# Patient Record
Sex: Female | Born: 1950 | Hispanic: No | Smoking: Never smoker
Health system: Southern US, Community
[De-identification: ages and names within clinical notes are randomized; demographics above are authoritative.]

## PROBLEM LIST (undated history)

## (undated) DIAGNOSIS — C801 Malignant (primary) neoplasm, unspecified: Secondary | ICD-10-CM

---

## 1997-12-20 ENCOUNTER — Other Ambulatory Visit: Admission: RE | Admit: 1997-12-20 | Discharge: 1997-12-20 | Payer: Self-pay | Admitting: *Deleted

## 1998-03-24 ENCOUNTER — Other Ambulatory Visit: Admission: RE | Admit: 1998-03-24 | Discharge: 1998-03-24 | Payer: Self-pay | Admitting: *Deleted

## 1998-05-24 ENCOUNTER — Other Ambulatory Visit: Admission: RE | Admit: 1998-05-24 | Discharge: 1998-05-24 | Payer: Self-pay | Admitting: *Deleted

## 1999-02-02 ENCOUNTER — Other Ambulatory Visit: Admission: RE | Admit: 1999-02-02 | Discharge: 1999-02-02 | Payer: Self-pay | Admitting: *Deleted

## 1999-05-25 ENCOUNTER — Emergency Department (HOSPITAL_COMMUNITY): Admission: EM | Admit: 1999-05-25 | Discharge: 1999-05-25 | Payer: Self-pay | Admitting: Emergency Medicine

## 1999-05-25 ENCOUNTER — Encounter: Payer: Self-pay | Admitting: Emergency Medicine

## 2000-02-21 ENCOUNTER — Other Ambulatory Visit: Admission: RE | Admit: 2000-02-21 | Discharge: 2000-02-21 | Payer: Self-pay | Admitting: *Deleted

## 2004-10-10 ENCOUNTER — Other Ambulatory Visit: Admission: RE | Admit: 2004-10-10 | Discharge: 2004-10-10 | Payer: Self-pay | Admitting: Obstetrics and Gynecology

## 2011-03-08 DIAGNOSIS — R05 Cough: Secondary | ICD-10-CM | POA: Insufficient documentation

## 2011-06-21 DIAGNOSIS — C07 Malignant neoplasm of parotid gland: Secondary | ICD-10-CM | POA: Insufficient documentation

## 2012-09-10 ENCOUNTER — Other Ambulatory Visit (HOSPITAL_COMMUNITY)
Admission: RE | Admit: 2012-09-10 | Discharge: 2012-09-10 | Disposition: A | Discharge status: Home / Self Care | Payer: BC Managed Care – PPO | Source: Ambulatory Visit | Attending: Family Medicine | Admitting: Family Medicine

## 2012-09-10 ENCOUNTER — Other Ambulatory Visit: Payer: Self-pay | Admitting: Physician Assistant

## 2012-09-10 DIAGNOSIS — Z124 Encounter for screening for malignant neoplasm of cervix: Secondary | ICD-10-CM | POA: Insufficient documentation

## 2014-03-08 ENCOUNTER — Encounter (HOSPITAL_COMMUNITY): Payer: Self-pay | Admitting: Nurse Practitioner

## 2014-03-08 ENCOUNTER — Emergency Department (HOSPITAL_COMMUNITY)
Admission: EM | Admit: 2014-03-08 | Discharge: 2014-03-08 | Disposition: A | Discharge status: Home / Self Care | Payer: BC Managed Care – PPO | Attending: Emergency Medicine | Admitting: Emergency Medicine

## 2014-03-08 DIAGNOSIS — Z859 Personal history of malignant neoplasm, unspecified: Secondary | ICD-10-CM | POA: Insufficient documentation

## 2014-03-08 DIAGNOSIS — R102 Pelvic and perineal pain: Secondary | ICD-10-CM | POA: Diagnosis present

## 2014-03-08 DIAGNOSIS — Z7982 Long term (current) use of aspirin: Secondary | ICD-10-CM | POA: Insufficient documentation

## 2014-03-08 DIAGNOSIS — N3091 Cystitis, unspecified with hematuria: Secondary | ICD-10-CM | POA: Insufficient documentation

## 2014-03-08 DIAGNOSIS — Z79899 Other long term (current) drug therapy: Secondary | ICD-10-CM | POA: Diagnosis not present

## 2014-03-08 HISTORY — DX: Malignant (primary) neoplasm, unspecified: C80.1

## 2014-03-08 LAB — CBC WITH DIFFERENTIAL/PLATELET
Basophils Absolute: 0 10*3/uL (ref 0.0–0.1)
Basophils Relative: 0 % (ref 0–1)
Eosinophils Absolute: 0.2 10*3/uL (ref 0.0–0.7)
Eosinophils Relative: 2 % (ref 0–5)
HCT: 43.3 % (ref 36.0–46.0)
Hemoglobin: 14.6 g/dL (ref 12.0–15.0)
Lymphocytes Relative: 18 % (ref 12–46)
Lymphs Abs: 2 10*3/uL (ref 0.7–4.0)
MCH: 30.1 pg (ref 26.0–34.0)
MCHC: 33.7 g/dL (ref 30.0–36.0)
MCV: 89.3 fL (ref 78.0–100.0)
Monocytes Absolute: 0.6 10*3/uL (ref 0.1–1.0)
Monocytes Relative: 5 % (ref 3–12)
Neutro Abs: 8.4 10*3/uL — ABNORMAL HIGH (ref 1.7–7.7)
Neutrophils Relative %: 75 % (ref 43–77)
Platelets: 207 10*3/uL (ref 150–400)
RBC: 4.85 MIL/uL (ref 3.87–5.11)
RDW: 13 % (ref 11.5–15.5)
WBC: 11.2 10*3/uL — ABNORMAL HIGH (ref 4.0–10.5)

## 2014-03-08 LAB — URINALYSIS, ROUTINE W REFLEX MICROSCOPIC
Glucose, UA: 100 mg/dL — AB
Ketones, ur: 40 mg/dL — AB
Nitrite: POSITIVE — AB
Protein, ur: >300 mg/dL — AB
Specific Gravity, Urine: 1.026 (ref 1.005–1.030)
Urobilinogen, UA: 1 mg/dL (ref 0.0–1.0)
pH: 5 (ref 5.0–8.0)

## 2014-03-08 LAB — COMPREHENSIVE METABOLIC PANEL
ALT: 26 U/L (ref 0–35)
AST: 24 U/L (ref 0–37)
Albumin: 3.9 g/dL (ref 3.5–5.2)
Alkaline Phosphatase: 58 U/L (ref 39–117)
Anion gap: 13 (ref 5–15)
BUN: 11 mg/dL (ref 6–23)
CO2: 23 mEq/L (ref 19–32)
Calcium: 9.5 mg/dL (ref 8.4–10.5)
Chloride: 103 mEq/L (ref 96–112)
Creatinine, Ser: 0.73 mg/dL (ref 0.50–1.10)
GFR calc Af Amer: >90 mL/min (ref >90)
GFR calc non Af Amer: 90 mL/min — ABNORMAL LOW (ref >90)
Glucose, Bld: 109 mg/dL — ABNORMAL HIGH (ref 70–99)
Potassium: 4.8 mEq/L (ref 3.7–5.3)
Sodium: 139 mEq/L (ref 137–147)
Total Bilirubin: 0.3 mg/dL (ref 0.3–1.2)
Total Protein: 7.4 g/dL (ref 6.0–8.3)

## 2014-03-08 LAB — URINE MICROSCOPIC-ADD ON

## 2014-03-08 MED ORDER — PHENAZOPYRIDINE HCL 200 MG PO TABS: 200.0000 mg | ORAL_TABLET | Freq: Three times a day (TID) | ORAL | Status: DC

## 2014-03-08 MED ORDER — CIPROFLOXACIN HCL 500 MG PO TABS: 500.0000 mg | ORAL_TABLET | Freq: Two times a day (BID) | ORAL | Status: DC

## 2014-03-08 NOTE — ED Provider Notes (Signed)
CSN: [id number]     Arrival date & time 03/08/14  1054 History   First MD Initiated Contact with Patient 03/08/14 1118     Chief Complaint  Patient presents with  . Vaginal Pain     (Consider location/radiation/quality/duration/timing/severity/associated sxs/prior Treatment) HPI Comments: Patient presents with urinary bleeding. She feels like she has some urinary pressure and frequency. She also has some burning when she urinates. Her symptoms started this morning. She denies abdominal or back pain. She denies any fevers or chills. She denies he nausea or vomiting. She had symptoms like this one time before when she was 62 years old with a urinary tract infection. She does not know if the blood is coming from her urine or her vagina area.  Patient is a 63 y.o. female presenting with vaginal pain.  Vaginal Pain Pertinent negatives include no chest pain, no abdominal pain, no headaches and no shortness of breath.    Past Medical History  Diagnosis Date  . Adenocarcinoma    History reviewed. No pertinent past surgical history. History reviewed. No pertinent family history. History  Substance Use Topics  . Smoking status: Never Smoker   . Smokeless tobacco: Not on file  . Alcohol Use: Yes     Comment: rare   OB History    No data available     Review of Systems  Constitutional: Negative for fever, chills, diaphoresis and fatigue.  HENT: Negative for congestion, rhinorrhea and sneezing.   Eyes: Negative.   Respiratory: Negative for cough, chest tightness and shortness of breath.   Cardiovascular: Negative for chest pain and leg swelling.  Gastrointestinal: Negative for nausea, vomiting, abdominal pain, diarrhea and blood in stool.  Genitourinary: Positive for dysuria, urgency, frequency, hematuria and vaginal pain. Negative for flank pain and difficulty urinating.  Musculoskeletal: Negative for back pain and arthralgias.  Skin: Negative for rash.  Neurological: Negative for  dizziness, speech difficulty, weakness, numbness and headaches.      Allergies  Review of patient's allergies indicates no known allergies.  Home Medications   Prior to Admission medications   Medication Sig Start Date End Date Taking? Authorizing Provider  aspirin EC 81 MG tablet Take 81 mg by mouth daily.   Yes Historical Provider, MD  CALCIUM PO Take by mouth 2 (two) times daily.   Yes Historical Provider, MD  cetirizine (ZYRTEC) 10 MG tablet Take 10 mg by mouth daily as needed for allergies.   Yes Historical Provider, MD  CRESTOR 20 MG tablet Take 20 mg by mouth daily.  02/19/14  Yes Historical Provider, MD  metoprolol succinate (TOPROL-XL) 25 MG 24 hr tablet Take 25 mg by mouth daily.  02/11/14  Yes Historical Provider, MD  Omega-3 Fatty Acids (FISH OIL PO) Take 1 capsule by mouth daily.   Yes Historical Provider, MD  omeprazole (PRILOSEC) 20 MG capsule Take 20 mg by mouth daily.   Yes Historical Provider, MD  Polyethyl Glycol-Propyl Glycol (SYSTANE OP) Apply 1 drop to eye daily as needed (Dry eyes).   Yes Historical Provider, MD  traMADol (ULTRAM) 50 MG tablet Take 50 mg by mouth 2 (two) times daily as needed for moderate pain.  01/05/14  Yes Historical Provider, MD  ciprofloxacin (CIPRO) 500 MG tablet Take 1 tablet (500 mg total) by mouth 2 (two) times daily. One po bid x 7 days 03/08/14   Malvin Johns, MD  phenazopyridine (PYRIDIUM) 200 MG tablet Take 1 tablet (200 mg total) by mouth 3 (three) times daily. 03/08/14  Malvin Johns, MD   BP 149/102 mmHg  Pulse 94  Temp(Src) 98.5 F (36.9 C) (Oral)  Resp 16  SpO2 96% Physical Exam  Constitutional: She is oriented to person, place, and time. She appears well-developed and well-nourished.  HENT:  Head: Normocephalic and atraumatic.  Eyes: Pupils are equal, round, and reactive to light.  Neck: Normal range of motion. Neck supple.  Cardiovascular: Normal rate, regular rhythm and normal heart sounds.   Pulmonary/Chest: Effort  normal and breath sounds normal. No respiratory distress. She has no wheezes. She has no rales. She exhibits no tenderness.  Abdominal: Soft. Bowel sounds are normal. There is no tenderness. There is no rebound and no guarding.  Genitourinary:  No vaginal bleeding  Musculoskeletal: Normal range of motion. She exhibits no edema.  Lymphadenopathy:    She has no cervical adenopathy.  Neurological: She is alert and oriented to person, place, and time.  Skin: Skin is warm and dry. No rash noted.  Psychiatric: She has a normal mood and affect.    ED Course  Procedures (including critical care time) Labs Review Labs Reviewed  URINALYSIS, ROUTINE W REFLEX MICROSCOPIC - Abnormal; Notable for the following:    Color, Urine RED (*)    APPearance TURBID (*)    Glucose, UA 100 (*)    Hgb urine dipstick LARGE (*)    Bilirubin Urine LARGE (*)    Ketones, ur 40 (*)    Protein, ur >300 (*)    Nitrite POSITIVE (*)    Leukocytes, UA LARGE (*)    All other components within normal limits  CBC WITH DIFFERENTIAL - Abnormal; Notable for the following:    WBC 11.2 (*)    Neutro Abs 8.4 (*)    All other components within normal limits  COMPREHENSIVE METABOLIC PANEL - Abnormal; Notable for the following:    Glucose, Bld 109 (*)    GFR calc non Af Amer 90 (*)    All other components within normal limits  URINE CULTURE  URINE MICROSCOPIC-ADD ON    Imaging Review No results found.   EKG Interpretation None      MDM   Final diagnoses:  Hemorrhagic cystitis    Urine appears to be infected. There is no vaginal bleeding. I will go ahead and start her on Cipro and her urine was sent for culture. She's otherwise well-appearing with no signs of pyelonephritis or systemic signs of illness. She was given referral to follow-up with [facility] if her symptoms are not improving. She was given return precautions.    Malvin Johns, MD 03/08/14 1302

## 2014-03-08 NOTE — Discharge Instructions (Signed)

## 2014-03-08 NOTE — ED Notes (Addendum)
She complains of sudden onset vaginal pain and bleeding at 0930 this am. She is 10 years post menopausal. She is unsure whether the bleeding is vaginal or urinary. She reports an intense pain after urination that "feels like i need to push a baby out." she reports urinary frequency and hesitancy. Denies n/v. She is unable to sit down because the pain is too bad

## 2014-03-11 LAB — URINE CULTURE: Colony Count: 50000

## 2014-03-15 ENCOUNTER — Telehealth (HOSPITAL_COMMUNITY): Payer: Self-pay

## 2014-03-15 NOTE — Telephone Encounter (Signed)
Post ED Visit - Positive Culture Follow-up  Culture report reviewed by antimicrobial stewardship pharmacist: []  Wes Dulaney, Pharm.D., BCPS []  Heide Guile, Pharm.D., BCPS [x]  Alycia Rossetti, Pharm.D., BCPS []  [place], Pharm.D., BCPS, AAHIVP []  Legrand Como, Pharm.D., BCPS, AAHIVP []  Isac Sarna, Pharm.D., BCPS  Positive Urine culture, 50,000 colonies -> E Coli Treated with Ciprofloxacin, organism sensitive to the same and no further patient follow-up is required at this time.  Dortha Kern 03/15/2014, 12:12 AM

## 2014-04-09 ENCOUNTER — Other Ambulatory Visit: Payer: Self-pay | Admitting: Physician Assistant

## 2014-04-09 ENCOUNTER — Ambulatory Visit
Admission: RE | Admit: 2014-04-09 | Discharge: 2014-04-09 | Disposition: A | Discharge status: Home / Self Care | Payer: BC Managed Care – PPO | Source: Ambulatory Visit | Attending: Physician Assistant | Admitting: Physician Assistant

## 2014-04-09 DIAGNOSIS — R52 Pain, unspecified: Secondary | ICD-10-CM

## 2014-04-09 IMAGING — CR DG FOOT COMPLETE 3+V*L*
3 series · 3 of 3 positions shown · non-contrast
Comparison: None.

CLINICAL DATA: Pain. No trauma. Initial evaluation. Symptoms for 6
months. Diffuse degenerative change. No evidence of fracture
dislocation .

EXAM:
LEFT FOOT - COMPLETE 3+ VIEW

[t foot ap left]
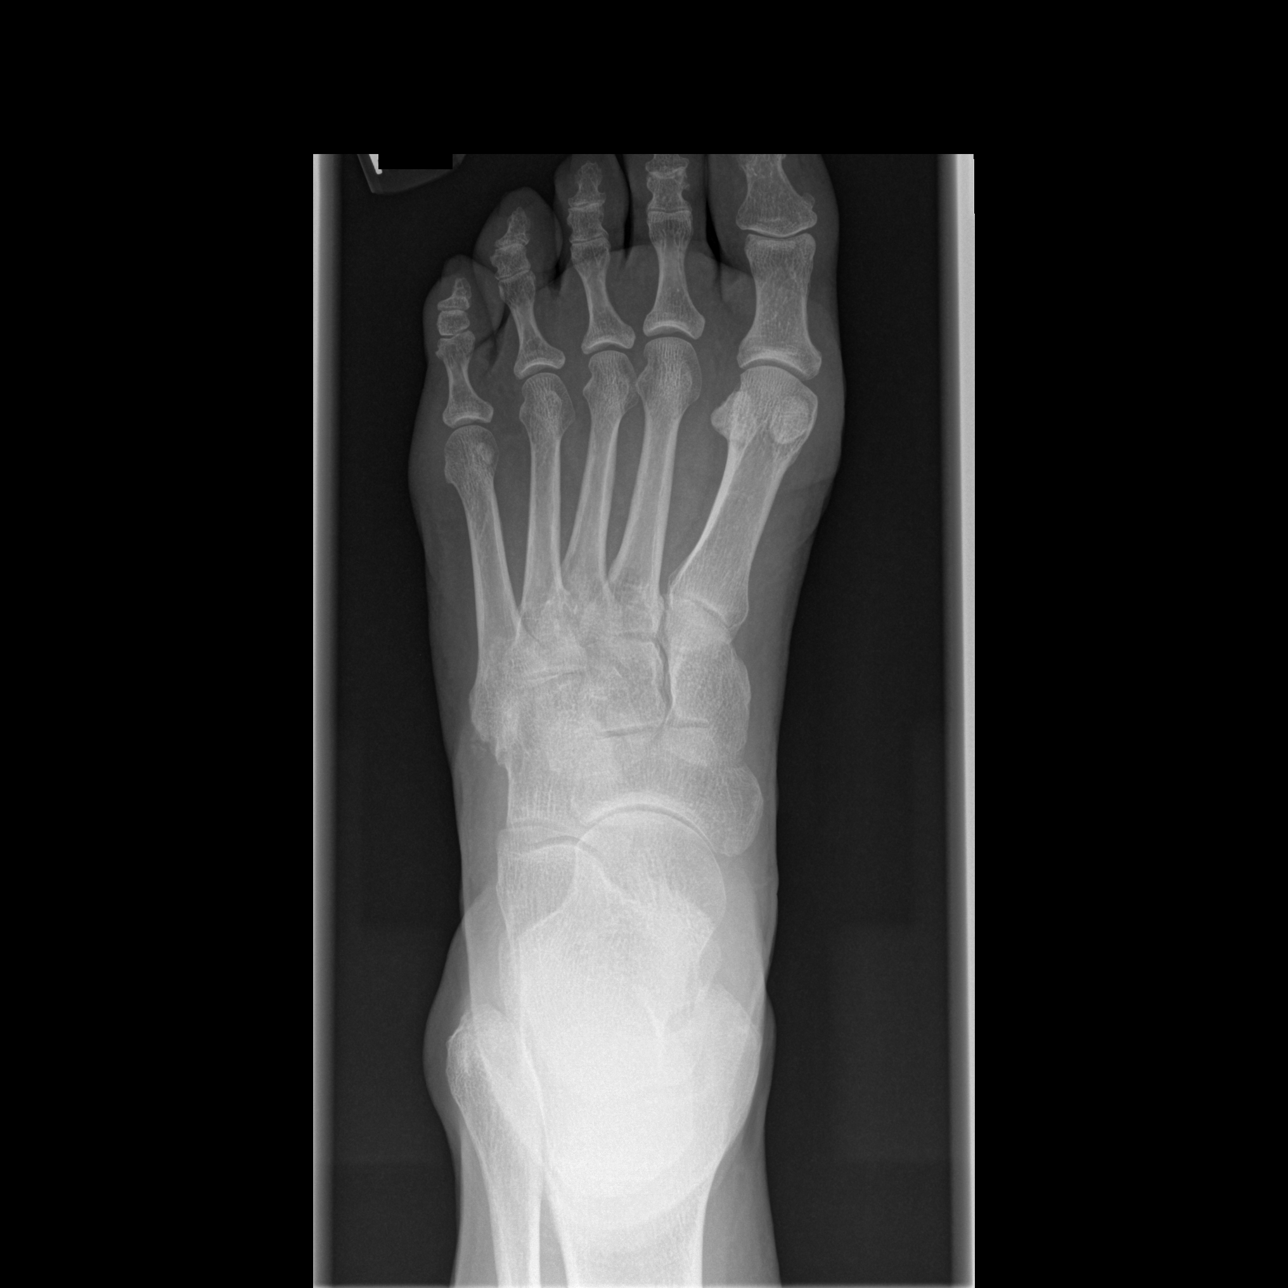

[t foot oblique left]
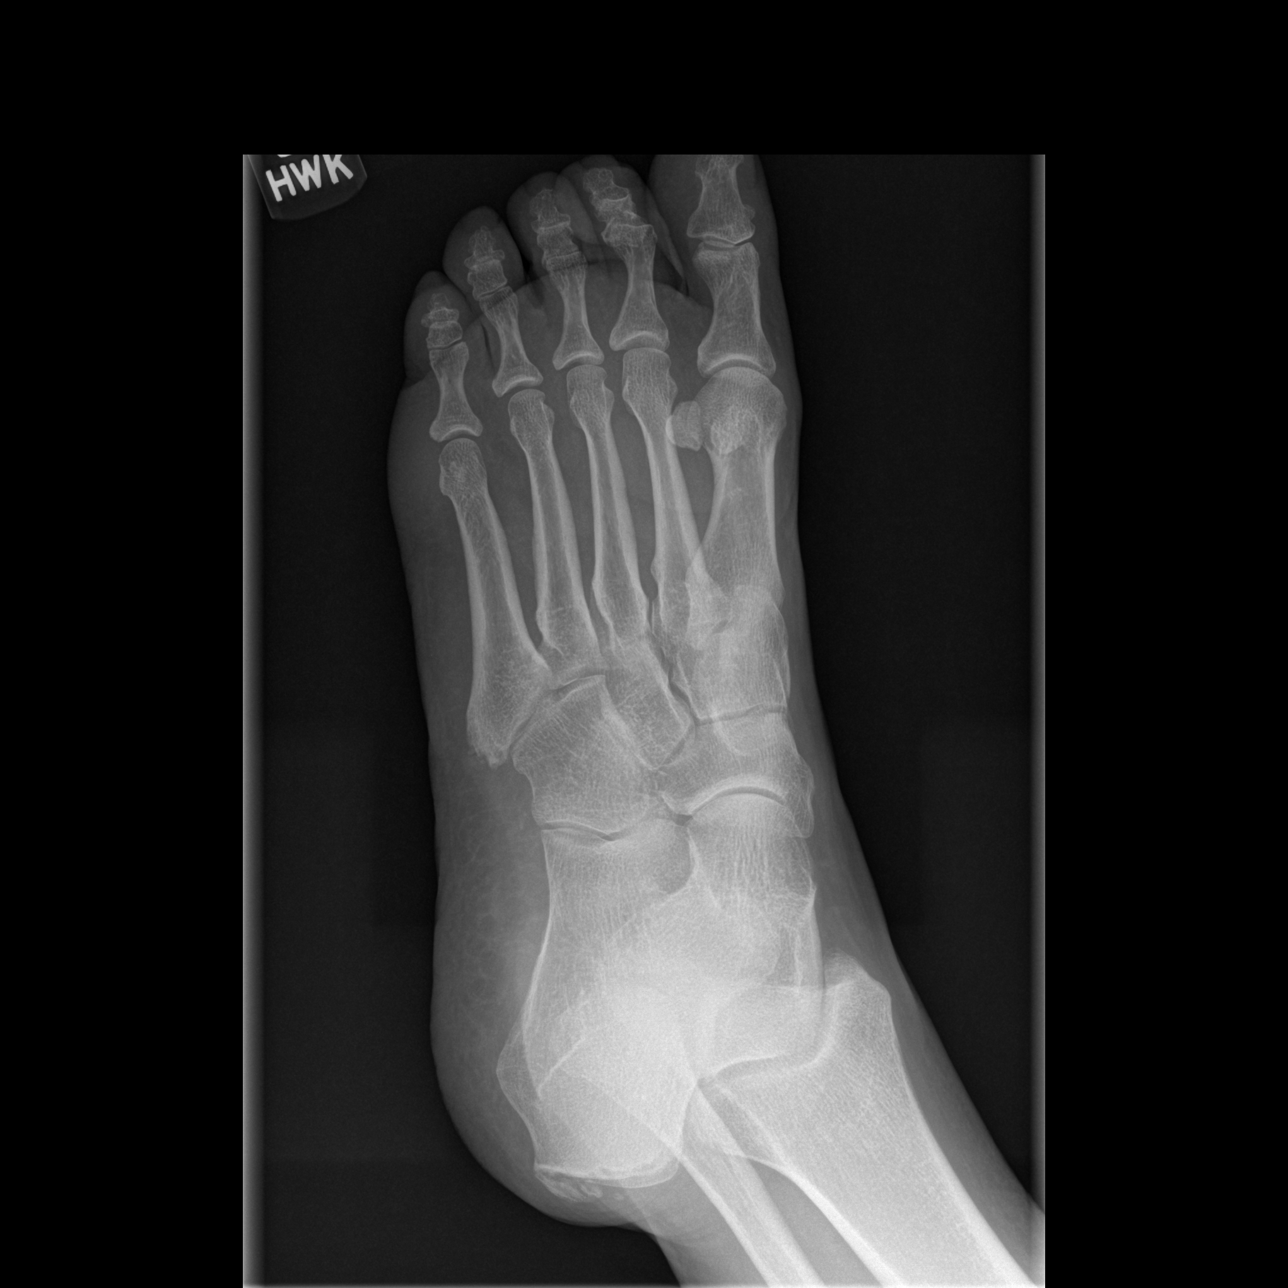

[t foot lat left]
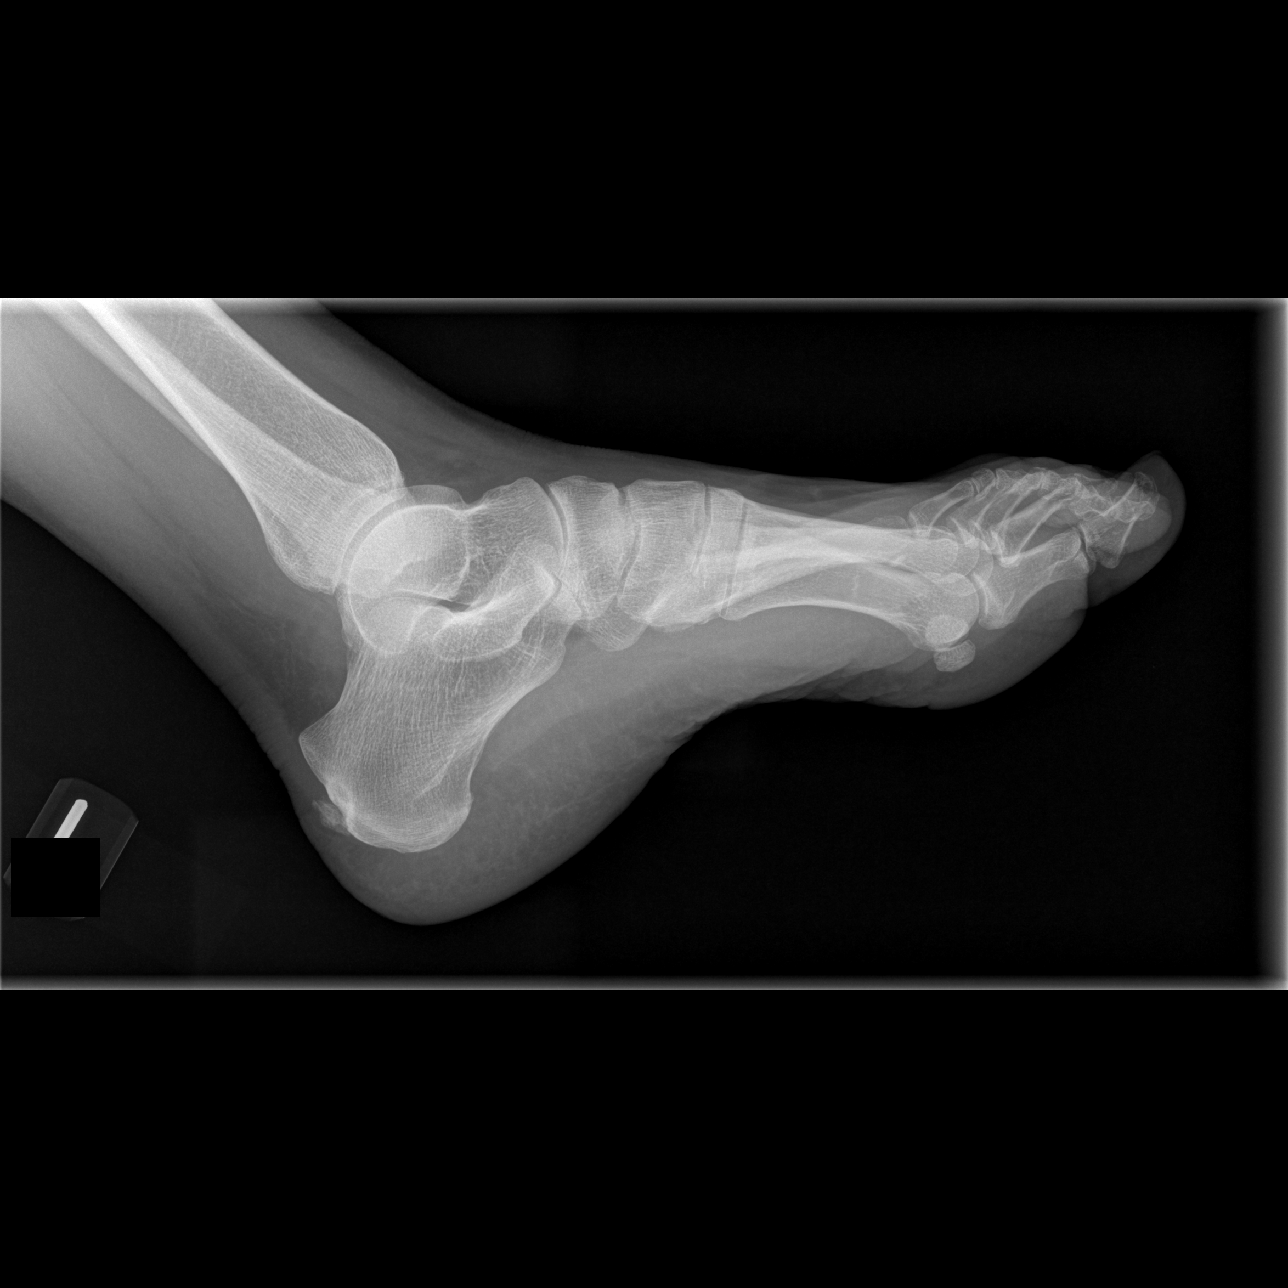

[3 of 3 positions shown; findings below may reference images not displayed]

FINDINGS: There is no evidence of fracture or dislocation. There is no
evidence of arthropathy or other focal bone abnormality. Soft
tissues are unremarkable.
IMPRESSION: No acute abnormality.

## 2015-10-21 ENCOUNTER — Other Ambulatory Visit (HOSPITAL_COMMUNITY)
Admission: RE | Admit: 2015-10-21 | Discharge: 2015-10-21 | Disposition: A | Discharge status: Home / Self Care | Payer: BC Managed Care – PPO | Source: Ambulatory Visit | Attending: Family Medicine | Admitting: Family Medicine

## 2015-10-21 ENCOUNTER — Other Ambulatory Visit: Payer: Self-pay | Admitting: Physician Assistant

## 2015-10-21 DIAGNOSIS — Z124 Encounter for screening for malignant neoplasm of cervix: Secondary | ICD-10-CM | POA: Diagnosis not present

## 2015-10-24 LAB — CYTOLOGY - PAP

## 2018-01-23 ENCOUNTER — Ambulatory Visit: Payer: Medicare Other | Admitting: Podiatry

## 2018-01-23 ENCOUNTER — Encounter: Payer: Self-pay | Admitting: Podiatry

## 2018-01-23 ENCOUNTER — Ambulatory Visit (INDEPENDENT_AMBULATORY_CARE_PROVIDER_SITE_OTHER): Payer: Medicare Other

## 2018-01-23 VITALS — BP 148/94 | HR 78 | Resp 16

## 2018-01-23 DIAGNOSIS — M779 Enthesopathy, unspecified: Secondary | ICD-10-CM

## 2018-01-23 DIAGNOSIS — G5782 Other specified mononeuropathies of left lower limb: Secondary | ICD-10-CM

## 2018-01-23 DIAGNOSIS — G5762 Lesion of plantar nerve, left lower limb: Secondary | ICD-10-CM

## 2018-01-23 DIAGNOSIS — M778 Other enthesopathies, not elsewhere classified: Secondary | ICD-10-CM

## 2018-01-23 NOTE — Progress Notes (Signed)
  Subjective:  Patient ID: Alison Ortiz, female    DOB: 04-25-1950,  MRN: [id number] HPI Chief Complaint  Patient presents with  . Toe Pain    4th toe left - sharp, shooting sensations x 3 years, no injury, had checked years ago and xrayed but they said nothing was wrong  . New Patient (Initial Visit)    67 y.o. female presents with the above complaint.   ROS: Denies fever chills nausea vomiting muscle aches pains calf pain back pain chest pain shortness of breath.  Past Medical History:  Diagnosis Date  . Adenocarcinoma ([place])    No past surgical history on file.  Current Outpatient Medications:  .  amLODipine (NORVASC) 2.5 MG tablet, Take 2.5 mg by mouth daily., Disp: , Rfl: 1 .  aspirin EC 81 MG tablet, Take 81 mg by mouth daily., Disp: , Rfl:  .  CALCIUM PO, Take by mouth 2 (two) times daily., Disp: , Rfl:  .  cetirizine (ZYRTEC) 10 MG tablet, Take 10 mg by mouth daily as needed for allergies., Disp: , Rfl:  .  CRESTOR 20 MG tablet, Take 20 mg by mouth daily. , Disp: , Rfl:  .  levothyroxine (SYNTHROID, LEVOTHROID) 75 MCG tablet, Take 75 mcg by mouth daily., Disp: , Rfl: 0 .  metoprolol succinate (TOPROL-XL) 25 MG 24 hr tablet, Take 25 mg by mouth daily. , Disp: , Rfl:  .  omeprazole (PRILOSEC) 20 MG capsule, Take 20 mg by mouth daily., Disp: , Rfl:  .  traMADol (ULTRAM) 50 MG tablet, Take 50 mg by mouth 2 (two) times daily as needed for moderate pain. , Disp: , Rfl:   No Known Allergies Review of Systems Objective:   Vitals:   01/23/18 1336  BP: (!) 148/94  Pulse: 78  Resp: 16    General: Well developed, nourished, in no acute distress, alert and oriented x3   Dermatological: Skin is warm, dry and supple bilateral. Nails x 10 are well maintained; remaining integument appears unremarkable at this time. There are no open sores, no preulcerative lesions, no rash or signs of infection present.  Vascular: Dorsalis Pedis artery and Posterior Tibial artery pedal pulses  are 2/4 bilateral with immedate capillary fill time. Pedal hair growth present. No varicosities and no lower extremity edema present bilateral.   Neruologic: Grossly intact via light touch bilateral. Vibratory intact via tuning fork bilateral. Protective threshold with Semmes Wienstein monofilament intact to all pedal sites bilateral. Patellar and Achilles deep tendon reflexes 2+ bilateral. No Babinski or clonus noted bilateral.  Palpable Mulder's click third interdigital space of the left foot they really nontender.  She has no tenderness on range of motion of the third or fourth metatarsal phalangeal joints.  Musculoskeletal: No gross boney pedal deformities bilateral. No pain, crepitus, or limitation noted with foot and ankle range of motion bilateral. Muscular strength 5/5 in all groups tested bilateral.  Gait: Unassisted, Nonantalgic.    Radiographs:  Radiographs taken today do not demonstrate any type of major osseous abnormalities.  Assessment & Plan:   Assessment: Neuroma third interdigital space left foot.  Scarcely symptomatic.  Plan: Discussed appropriate shoe gear offered an injection she declined follow-up with her as needed.     Ronnel Zuercher T. [place], [place]

## 2020-04-20 DIAGNOSIS — C07 Malignant neoplasm of parotid gland: Secondary | ICD-10-CM | POA: Diagnosis not present

## 2020-04-20 DIAGNOSIS — H9212 Otorrhea, left ear: Secondary | ICD-10-CM | POA: Diagnosis not present

## 2020-04-20 DIAGNOSIS — L089 Local infection of the skin and subcutaneous tissue, unspecified: Secondary | ICD-10-CM | POA: Diagnosis not present

## 2020-04-20 DIAGNOSIS — H61892 Other specified disorders of left external ear: Secondary | ICD-10-CM | POA: Diagnosis not present

## 2020-06-01 DIAGNOSIS — H938X2 Other specified disorders of left ear: Secondary | ICD-10-CM | POA: Diagnosis not present

## 2020-06-01 DIAGNOSIS — C07 Malignant neoplasm of parotid gland: Secondary | ICD-10-CM | POA: Diagnosis not present

## 2020-06-07 DIAGNOSIS — E039 Hypothyroidism, unspecified: Secondary | ICD-10-CM | POA: Diagnosis not present

## 2020-06-07 DIAGNOSIS — I10 Essential (primary) hypertension: Secondary | ICD-10-CM | POA: Diagnosis not present

## 2020-06-07 DIAGNOSIS — K219 Gastro-esophageal reflux disease without esophagitis: Secondary | ICD-10-CM | POA: Diagnosis not present

## 2020-06-07 DIAGNOSIS — Z1211 Encounter for screening for malignant neoplasm of colon: Secondary | ICD-10-CM | POA: Diagnosis not present

## 2020-06-07 DIAGNOSIS — E7800 Pure hypercholesterolemia, unspecified: Secondary | ICD-10-CM | POA: Diagnosis not present

## 2020-06-07 DIAGNOSIS — F4321 Adjustment disorder with depressed mood: Secondary | ICD-10-CM | POA: Diagnosis not present

## 2020-09-27 DIAGNOSIS — Z1211 Encounter for screening for malignant neoplasm of colon: Secondary | ICD-10-CM | POA: Diagnosis not present

## 2020-09-27 DIAGNOSIS — E7800 Pure hypercholesterolemia, unspecified: Secondary | ICD-10-CM | POA: Diagnosis not present

## 2020-10-26 DIAGNOSIS — Z1231 Encounter for screening mammogram for malignant neoplasm of breast: Secondary | ICD-10-CM | POA: Diagnosis not present

## 2020-10-27 DIAGNOSIS — F419 Anxiety disorder, unspecified: Secondary | ICD-10-CM | POA: Diagnosis not present

## 2020-10-27 DIAGNOSIS — R030 Elevated blood-pressure reading, without diagnosis of hypertension: Secondary | ICD-10-CM | POA: Diagnosis not present

## 2020-10-27 DIAGNOSIS — F4320 Adjustment disorder, unspecified: Secondary | ICD-10-CM | POA: Diagnosis not present

## 2020-10-27 DIAGNOSIS — F430 Acute stress reaction: Secondary | ICD-10-CM | POA: Diagnosis not present

## 2020-12-07 DIAGNOSIS — C07 Malignant neoplasm of parotid gland: Secondary | ICD-10-CM | POA: Diagnosis not present

## 2020-12-08 DIAGNOSIS — F419 Anxiety disorder, unspecified: Secondary | ICD-10-CM | POA: Diagnosis not present

## 2020-12-08 DIAGNOSIS — I10 Essential (primary) hypertension: Secondary | ICD-10-CM | POA: Diagnosis not present

## 2020-12-08 DIAGNOSIS — K219 Gastro-esophageal reflux disease without esophagitis: Secondary | ICD-10-CM | POA: Diagnosis not present

## 2020-12-08 DIAGNOSIS — E7800 Pure hypercholesterolemia, unspecified: Secondary | ICD-10-CM | POA: Diagnosis not present

## 2020-12-08 DIAGNOSIS — Z0000 Encounter for general adult medical examination without abnormal findings: Secondary | ICD-10-CM | POA: Diagnosis not present

## 2020-12-08 DIAGNOSIS — Z23 Encounter for immunization: Secondary | ICD-10-CM | POA: Diagnosis not present

## 2020-12-08 DIAGNOSIS — E039 Hypothyroidism, unspecified: Secondary | ICD-10-CM | POA: Diagnosis not present

## 2020-12-26 DIAGNOSIS — H524 Presbyopia: Secondary | ICD-10-CM | POA: Diagnosis not present

## 2020-12-26 DIAGNOSIS — H5213 Myopia, bilateral: Secondary | ICD-10-CM | POA: Diagnosis not present

## 2021-03-10 ENCOUNTER — Emergency Department (HOSPITAL_COMMUNITY)
Admission: EM | Admit: 2021-03-10 | Discharge: 2021-03-11 | Disposition: A | Discharge status: Home / Self Care | Payer: Medicare PPO | Attending: Emergency Medicine | Admitting: Emergency Medicine

## 2021-03-10 ENCOUNTER — Emergency Department (HOSPITAL_COMMUNITY): Payer: Medicare PPO

## 2021-03-10 ENCOUNTER — Encounter (HOSPITAL_COMMUNITY): Payer: Self-pay

## 2021-03-10 DIAGNOSIS — R519 Headache, unspecified: Secondary | ICD-10-CM | POA: Diagnosis not present

## 2021-03-10 DIAGNOSIS — Z859 Personal history of malignant neoplasm, unspecified: Secondary | ICD-10-CM | POA: Insufficient documentation

## 2021-03-10 DIAGNOSIS — Z20822 Contact with and (suspected) exposure to covid-19: Secondary | ICD-10-CM | POA: Diagnosis not present

## 2021-03-10 DIAGNOSIS — Z7982 Long term (current) use of aspirin: Secondary | ICD-10-CM | POA: Diagnosis not present

## 2021-03-10 DIAGNOSIS — I7774 Dissection of vertebral artery: Secondary | ICD-10-CM | POA: Diagnosis not present

## 2021-03-10 DIAGNOSIS — R930 Abnormal findings on diagnostic imaging of skull and head, not elsewhere classified: Secondary | ICD-10-CM | POA: Insufficient documentation

## 2021-03-10 LAB — COMPREHENSIVE METABOLIC PANEL
ALT: 14 U/L (ref 0–44)
AST: 18 U/L (ref 15–41)
Albumin: 3.8 g/dL (ref 3.5–5.0)
Alkaline Phosphatase: 49 U/L (ref 38–126)
Anion gap: 7 (ref 5–15)
BUN: 11 mg/dL (ref 8–23)
CO2: 27 mmol/L (ref 22–32)
Calcium: 9.3 mg/dL (ref 8.9–10.3)
Chloride: 103 mmol/L (ref 98–111)
Creatinine, Ser: 0.9 mg/dL (ref 0.44–1.00)
GFR, Estimated: >60 mL/min (ref >60)
Glucose, Bld: 113 mg/dL — ABNORMAL HIGH (ref 70–99)
Potassium: 4.4 mmol/L (ref 3.5–5.1)
Sodium: 137 mmol/L (ref 135–145)
Total Bilirubin: 0.4 mg/dL (ref 0.3–1.2)
Total Protein: 6.6 g/dL (ref 6.5–8.1)

## 2021-03-10 LAB — CBC WITH DIFFERENTIAL/PLATELET
Abs Immature Granulocytes: 0.02 10*3/uL (ref 0.00–0.07)
Basophils Absolute: 0 10*3/uL (ref 0.0–0.1)
Basophils Relative: 0 %
Eosinophils Absolute: 0.3 10*3/uL (ref 0.0–0.5)
Eosinophils Relative: 3 %
HCT: 41.6 % (ref 36.0–46.0)
Hemoglobin: 14.2 g/dL (ref 12.0–15.0)
Immature Granulocytes: 0 %
Lymphocytes Relative: 39 %
Lymphs Abs: 3.4 10*3/uL (ref 0.7–4.0)
MCH: 31.6 pg (ref 26.0–34.0)
MCHC: 34.1 g/dL (ref 30.0–36.0)
MCV: 92.7 fL (ref 80.0–100.0)
Monocytes Absolute: 0.8 10*3/uL (ref 0.1–1.0)
Monocytes Relative: 9 %
Neutro Abs: 4.2 10*3/uL (ref 1.7–7.7)
Neutrophils Relative %: 49 %
Platelets: 214 10*3/uL (ref 150–400)
RBC: 4.49 MIL/uL (ref 3.87–5.11)
RDW: 12.8 % (ref 11.5–15.5)
WBC: 8.6 10*3/uL (ref 4.0–10.5)
nRBC: 0 % (ref 0.0–0.2)

## 2021-03-10 LAB — RESP PANEL BY RT-PCR (FLU A&B, COVID) ARPGX2
Influenza A by PCR: NEGATIVE
Influenza B by PCR: NEGATIVE
SARS Coronavirus 2 by RT PCR: NEGATIVE

## 2021-03-10 IMAGING — CT CT HEAD W/O CM
4 series · 17 of 47 positions shown, 19 images · non-contrast
Comparison: None.

CLINICAL DATA: Headache.

EXAM:
CT HEAD WITHOUT CONTRAST
TECHNIQUE: Contiguous axial images were obtained from the base of the skull
through the vertex without intravenous contrast.

[Series 3: head without · axial · non-contrast · 0.41mm/px · z∈[-227,-107]mm · 7 of 33 slices shown, 9 images]
[im 5/33  brain]
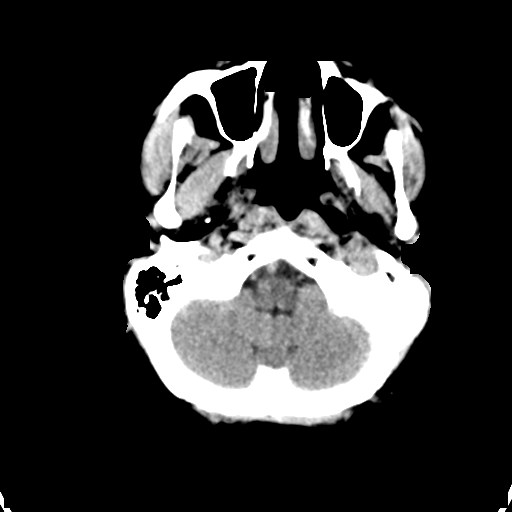
[im 5/33  bone]
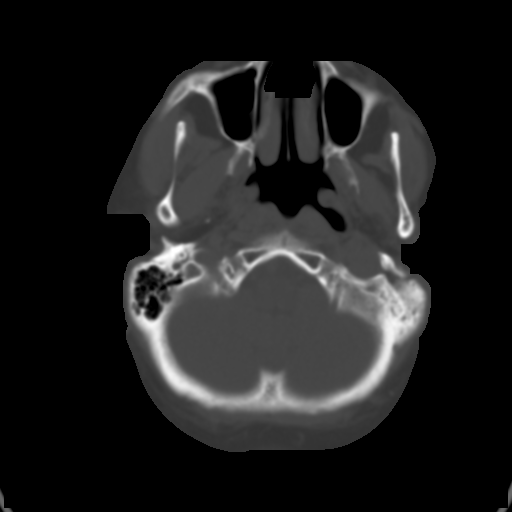
[im 9/33  brain]
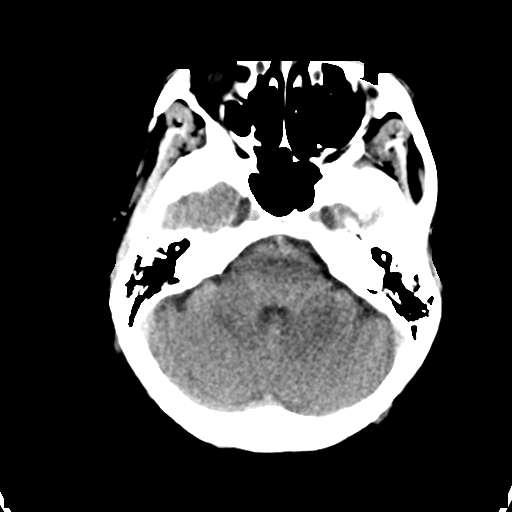
[im 13/33  brain]
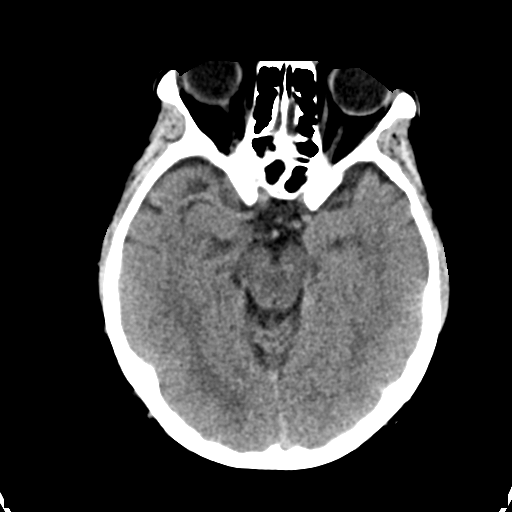
[im 17/33  brain]
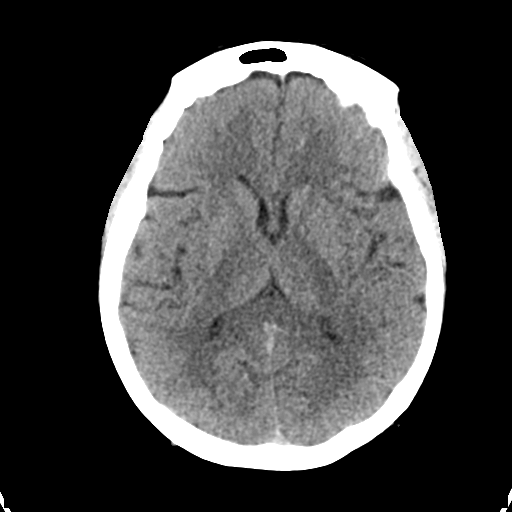
[im 21/33  brain]
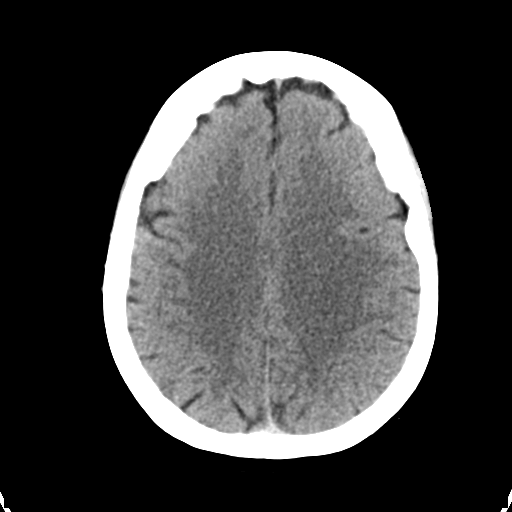
[im 21/33  bone]
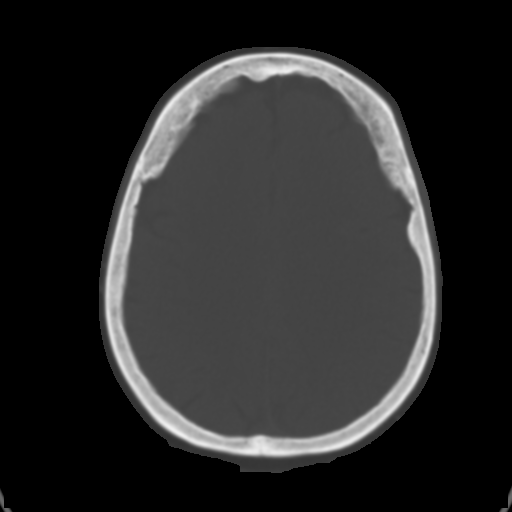
[im 25/33  brain]
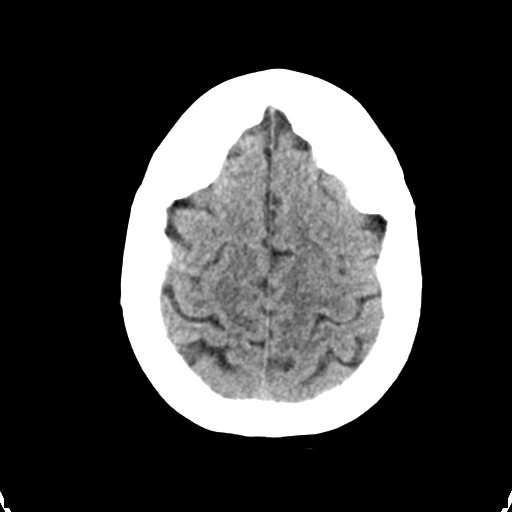
[im 29/33  brain]
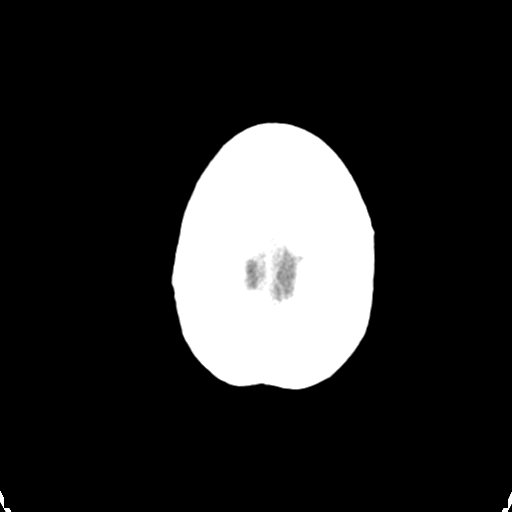

[Series 4: head bone · axial · 0.41mm/px · z∈[-231,-175]mm · 4 of 83 slices shown]
[im 9/83  bone]
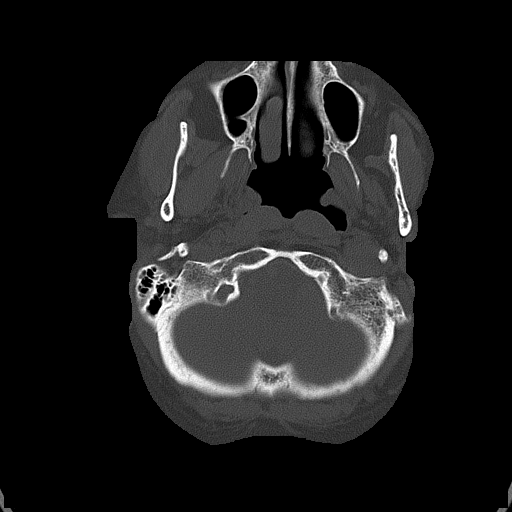
[im 17/83  bone]
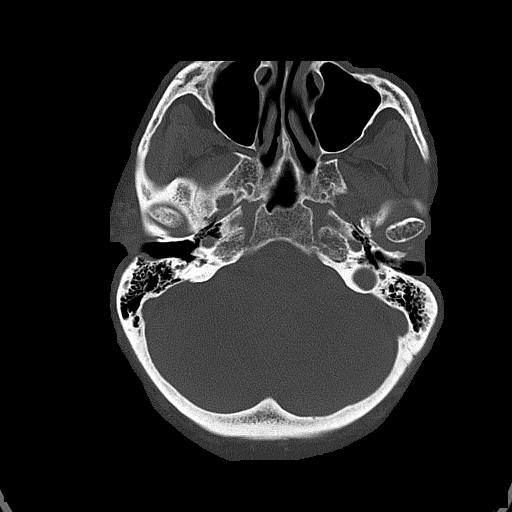
[im 25/83  bone]
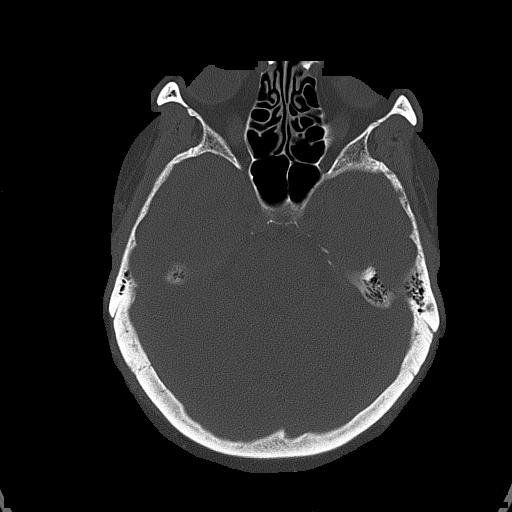
[im 37/83  bone]
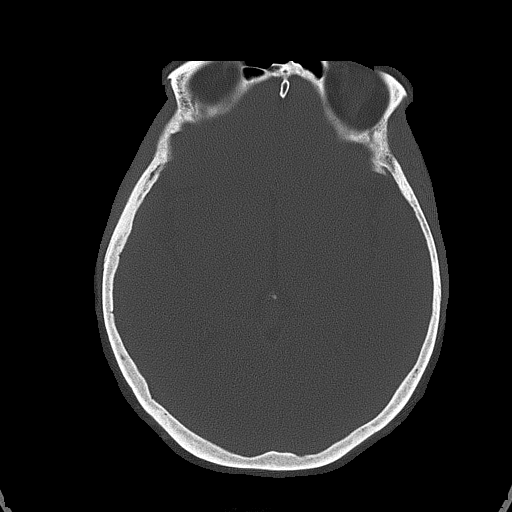

[Series 5: head without cor · coronal · non-contrast · 0.33mm/px · 3 of 67 slices shown]
[im 23/67  brain]
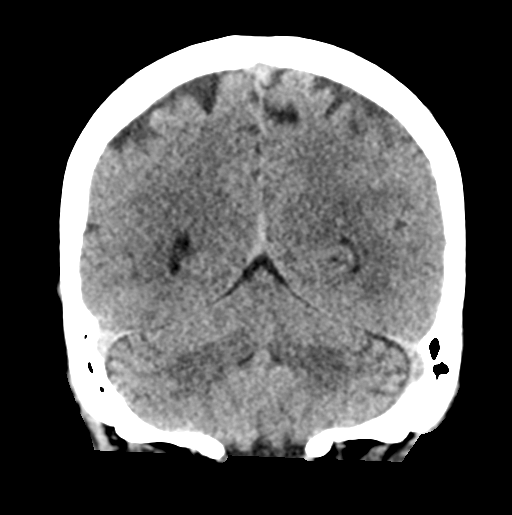
[im 30/67  brain]
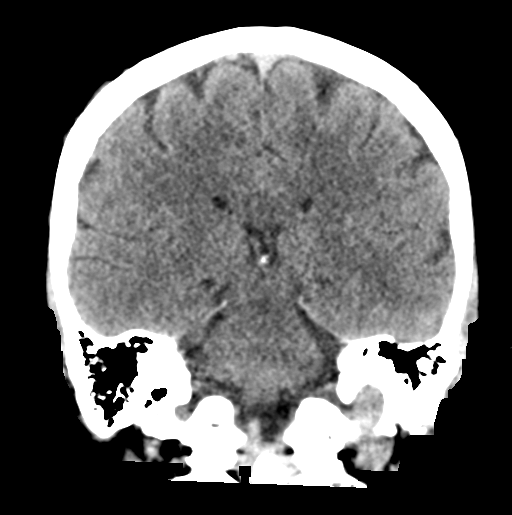
[im 37/67  brain]
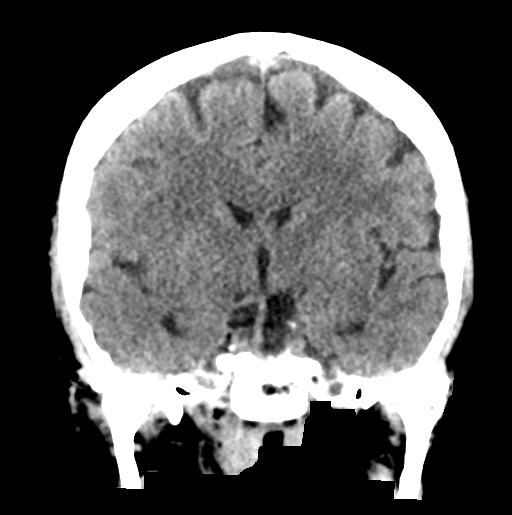

[Series 6: head without sag · sagittal · non-contrast · 0.34mm/px · 3 of 58 slices shown]
[im 20/58  brain]
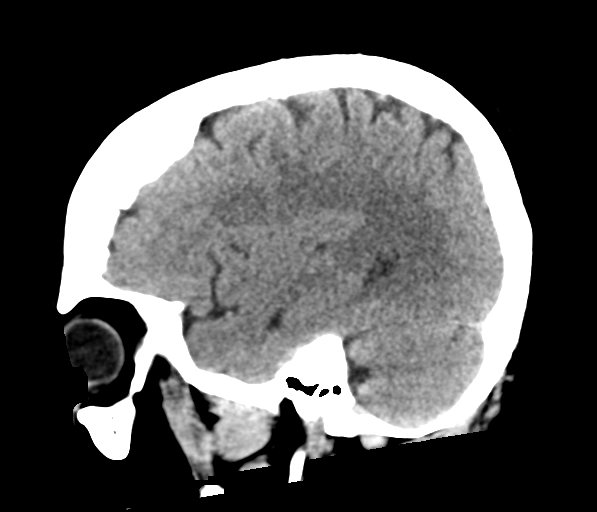
[im 29/58  brain]
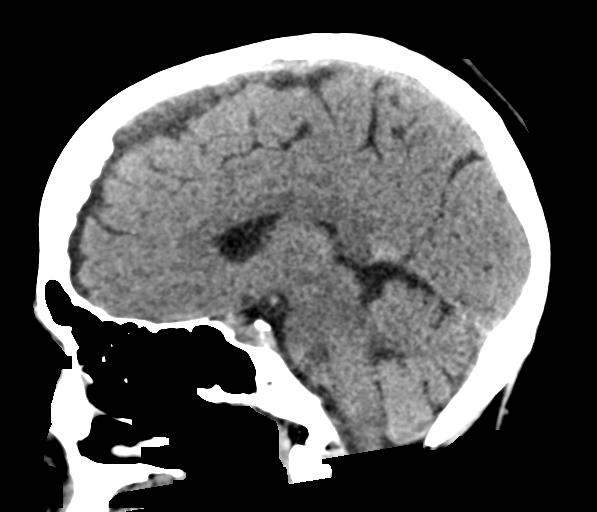
[im 39/58  brain]
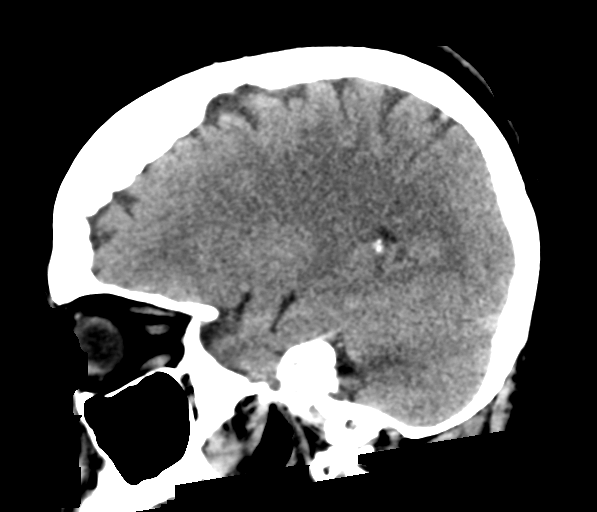

[17 of 47 positions shown; findings below may reference images not displayed]

FINDINGS: Brain: No evidence of acute infarction, hemorrhage, hydrocephalus,
extra-axial collection or mass lesion/mass effect.

Vascular: No hyperdense vessel or unexpected calcification.

Skull: Normal. Negative for fracture or focal lesion.

Sinuses/Orbits: No acute finding.

Other: None.
IMPRESSION: No acute intracranial pathology.

## 2021-03-10 MED ORDER — PROCHLORPERAZINE EDISYLATE 10 MG/2ML IJ SOLN
10.0000 mg | Freq: Once | INTRAMUSCULAR | Status: AC
  Administered 2021-03-11: 10 mg via INTRAVENOUS
  Filled 2021-03-10: qty 2

## 2021-03-10 MED ORDER — KETOROLAC TROMETHAMINE 15 MG/ML IJ SOLN
15.0000 mg | Freq: Once | INTRAMUSCULAR | Status: AC
  Administered 2021-03-11: 15 mg via INTRAVENOUS
  Filled 2021-03-10: qty 1

## 2021-03-10 MED ORDER — ACETAMINOPHEN 500 MG PO TABS
1000.0000 mg | ORAL_TABLET | Freq: Once | ORAL | Status: AC
  Administered 2021-03-10: 1000 mg via ORAL
  Filled 2021-03-10: qty 2

## 2021-03-10 MED ORDER — ONDANSETRON HCL 4 MG/2ML IJ SOLN
4.0000 mg | Freq: Once | INTRAMUSCULAR | Status: AC
  Administered 2021-03-11: 4 mg via INTRAVENOUS
  Filled 2021-03-10: qty 2

## 2021-03-10 NOTE — ED Provider Notes (Addendum)
Emergency Medicine Provider Triage Evaluation Note  Alison Ortiz , a 70 y.o. female  was evaluated in triage.  Pt complains of headache that started 15-20 mins PTA.  Headache in front of her head, throbbing in nature.  Also reports diarrhea on arrival to ED.  Son in law currently sick with a cold.  She takes baby ASA only.  States she never gets headaches so is concerned.  No meds taken PTA.  Review of Systems  Positive: Headache, diarrhea Negative: fever  Physical Exam  BP (!) 170/98    Pulse 78    Temp (!) 97.5 F (36.4 C)    Resp 16    SpO2 97%   Gen:   Awake, no distress   Resp:  Normal effort  MSK:   Moves extremities without difficulty  Other:  AAOx3, moving extremities well, answering questions and following commands, ambulatory  Medical Decision Making  Medically screening exam initiated at 10:23 PM.  Appropriate orders placed.  Alison Ortiz was informed that the remainder of the evaluation will be completed by another provider, this initial triage assessment does not replace that evaluation, and the importance of remaining in the ED until their evaluation is complete.  Headache and diarrhea with recent sick contacts.  No focal deficits noted in triage.  Takes baby ASA but no other anticoagulation.  Will check labs, covid/flu screening, CT head.  Tylenol for headache.   Larene Pickett, PA-C 03/10/21 2226    Larene Pickett, PA-C 03/10/21 2226    Milton Ferguson, MD 03/10/21 [phone]

## 2021-03-10 NOTE — ED Triage Notes (Signed)
Pt states that she began to have a headache that started about 15 minutes ago, denies a hx of headaches, denies nausea or photosensitivity

## 2021-03-11 ENCOUNTER — Emergency Department (HOSPITAL_COMMUNITY): Payer: Medicare PPO

## 2021-03-11 ENCOUNTER — Other Ambulatory Visit: Payer: Self-pay

## 2021-03-11 ENCOUNTER — Encounter (HOSPITAL_COMMUNITY): Payer: Self-pay

## 2021-03-11 DIAGNOSIS — R519 Headache, unspecified: Secondary | ICD-10-CM | POA: Diagnosis not present

## 2021-03-11 DIAGNOSIS — I7774 Dissection of vertebral artery: Secondary | ICD-10-CM | POA: Diagnosis not present

## 2021-03-11 IMAGING — CT CT ANGIO HEAD
2 of 10 series · 17 of 47 positions shown · IV contrast (APPLIED)
Comparison: No prior CTA, correlation is made with [DATE] CT
head

CLINICAL DATA: Sudden headache, severe

EXAM:
CT ANGIOGRAPHY HEAD
TECHNIQUE: Multidetector CT imaging of the head was performed using the
standard protocol during bolus administration of intravenous
contrast. Multiplanar CT image reconstructions and MIPs were
obtained to evaluate the vascular anatomy.
CONTRAST:  50mL [NA] IOPAMIDOL ([NA]) INJECTION 76%

[Series 8: headangio 1.0 mpr ax · axial · 0.39mm/px · z∈[+1134,+1300]mm · 14 of 192 slices shown]
[im 13/192  brain]
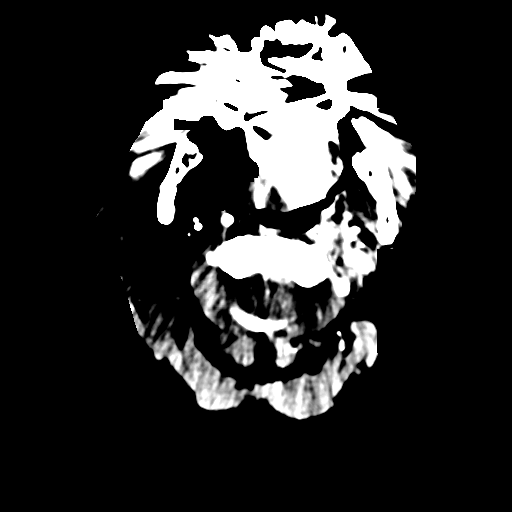
[im 26/192  bone]
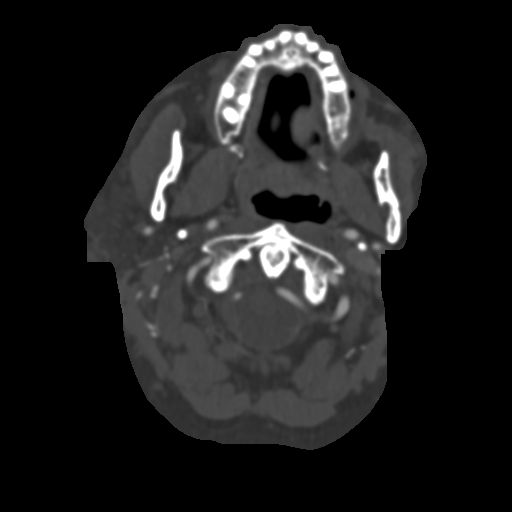
[im 39/192  brain]
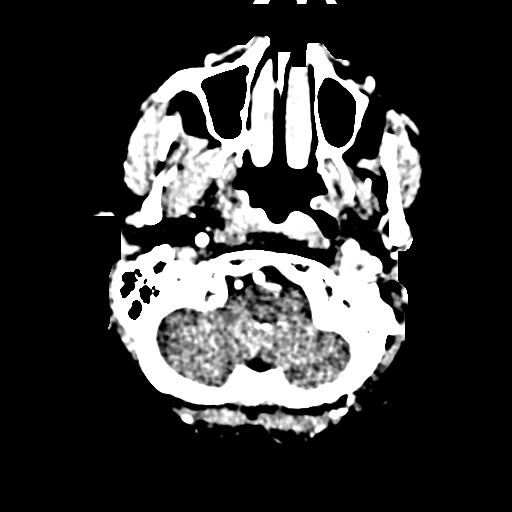
[im 51/192  bone]
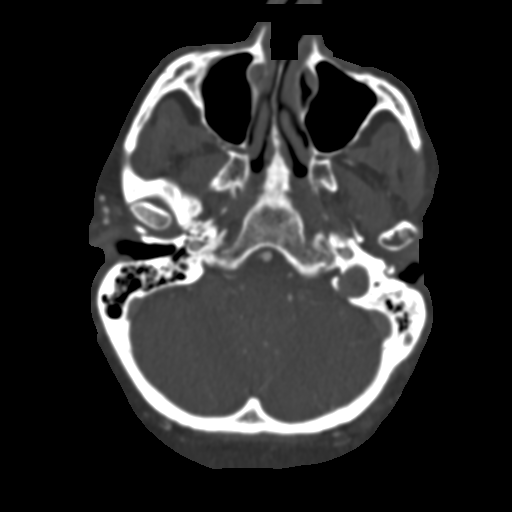
[im 64/192  brain]
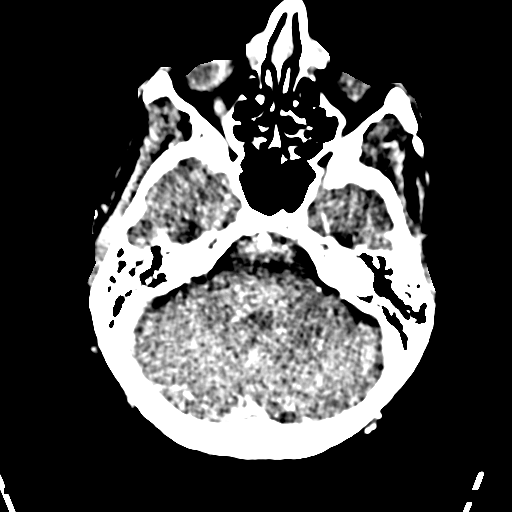
[im 77/192  bone]
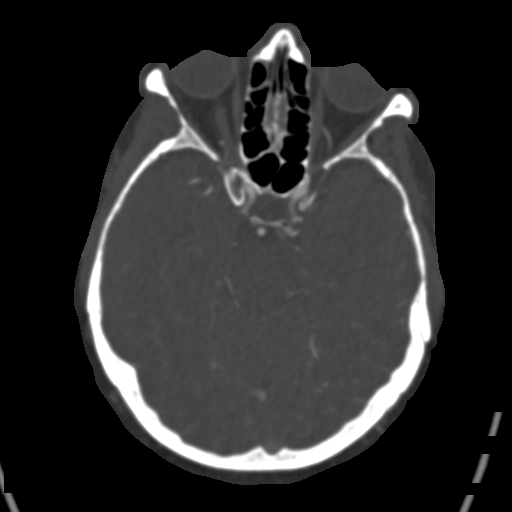
[im 90/192  brain]
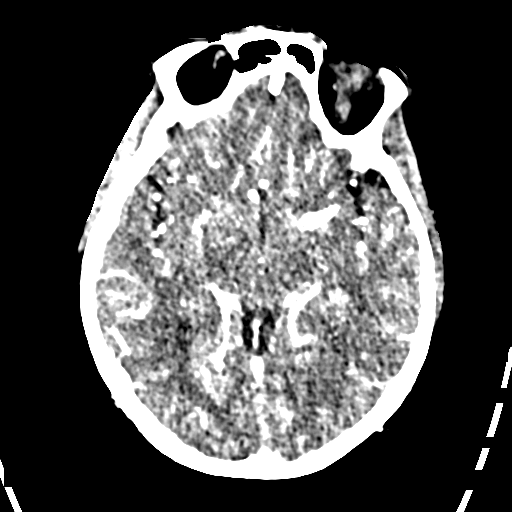
[im 102/192  bone]
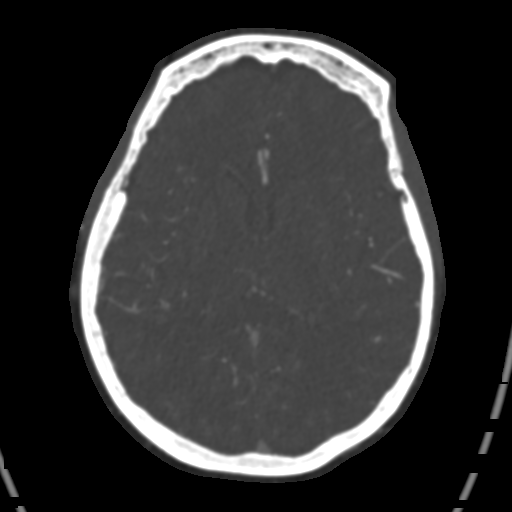
[im 115/192  brain]
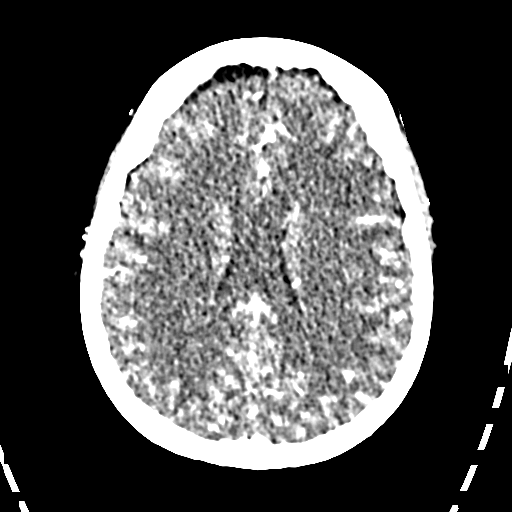
[im 128/192  bone]
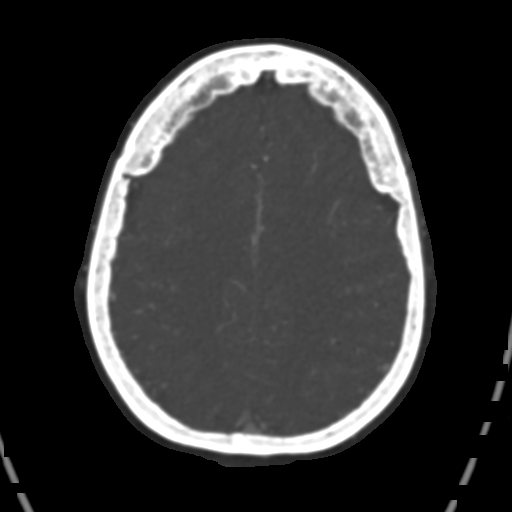
[im 141/192  brain]
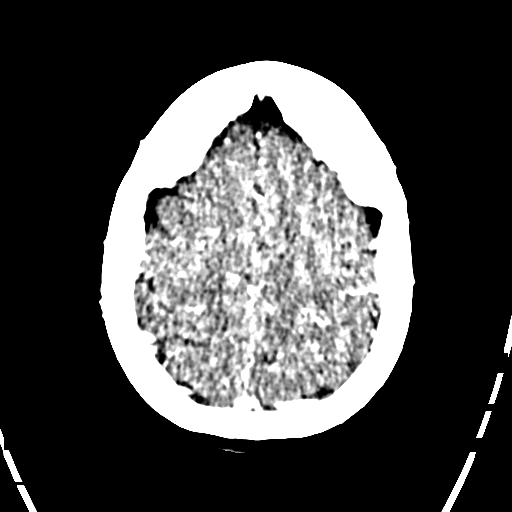
[im 153/192  bone]
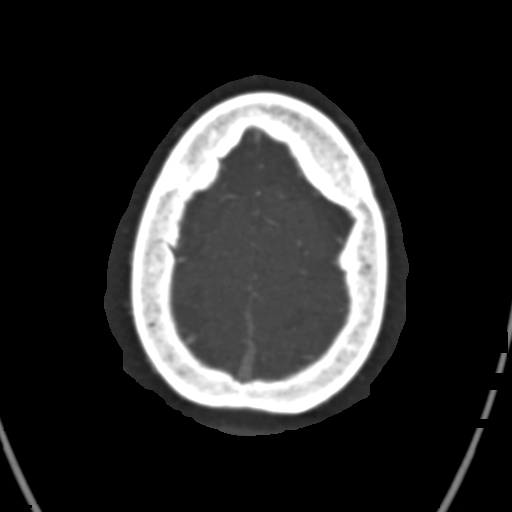
[im 166/192  brain]
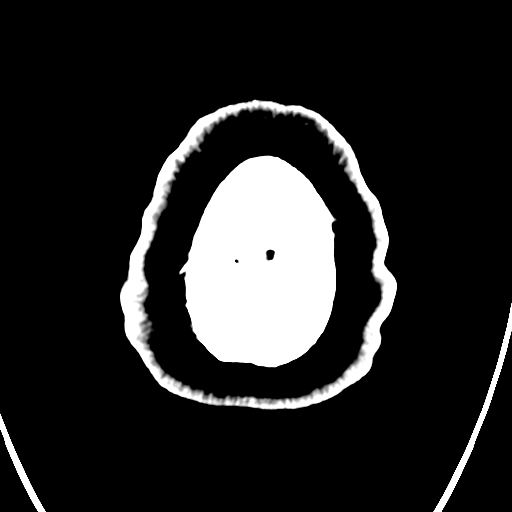
[im 179/192  bone]
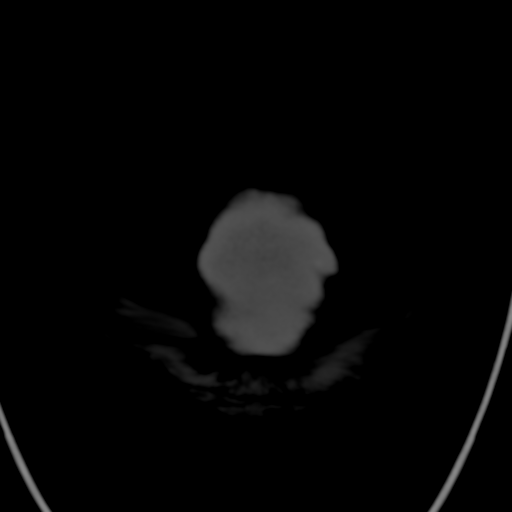

[Series 9: headangio 1.0 mpr cor · coronal · 0.37mm/px · 3 of 201 slices shown]
[im 58/201  brain]
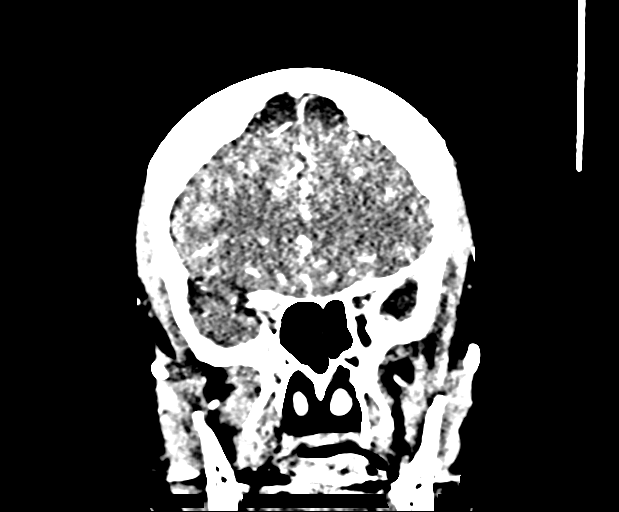
[im 86/201  brain]
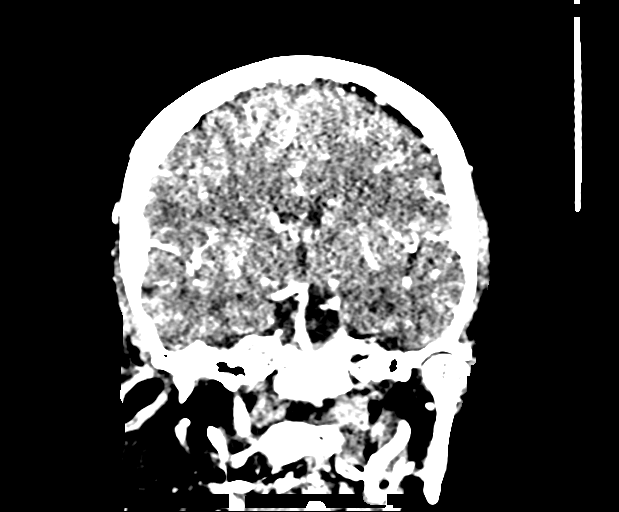
[im 115/201  brain]
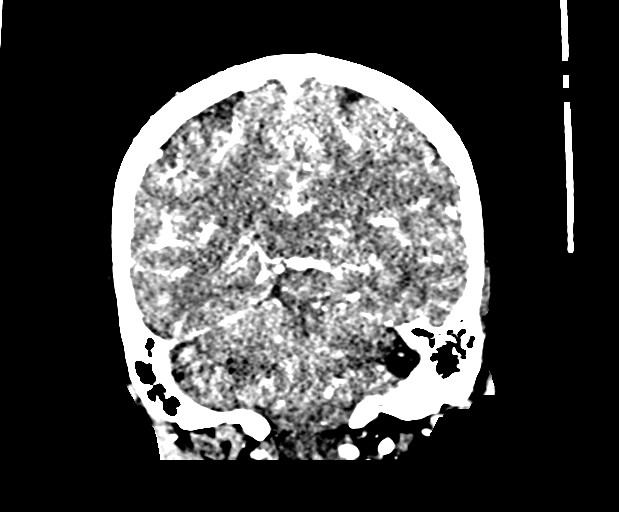

[17 of 47 positions shown; findings below may reference images not displayed]

FINDINGS: CT HEAD

For noncontrast findings, please see same day CT head.

CTA HEAD

Anterior circulation: Both distal extracranial and intracranial
internal carotid arteries are patent to the termini, without
stenosis. A1 segments patent. Normal anterior communicating artery.
Anterior cerebral arteries are patent to their distal aspects. No M1
stenosis or occlusion. Normal MCA bifurcations. Distal MCA branches
perfused; enhancement is slightly more robust in the left MCA
branches, although there is no focal stenosis or occlusion of right
MCA branches.

Posterior circulation: In the distal left V3 segment, there is an
anteromedially directed outpouching, which measures 1 x 2 mm (series
9, image 106 and series 8, image 184), possibly a small dissection.

Intracranial vertebral arteries are patent to the vertebrobasilar
junction without stenosis. Posterior inferior cerebral arteries
patent bilaterally. Basilar patent to its distal aspect. Superior
cerebellar arteries patent bilaterally. PCAs perfused to their
distal aspects without stenosis. The left posterior communicating
artery is visualized. The right posterior communicating artery is
not visualized.

Venous sinuses: As permitted by contrast timing, patent.

Anatomic variants: None significant

Review of the MIP images confirms the above findings.
IMPRESSION: 1. Small outpouching from the distal left extracranial vertebral
artery, which may represent a small dissection.
2. No intracranial large vessel occlusion or significant stenosis.

## 2021-03-11 IMAGING — MR MR MRA HEAD W/O CM
1 series · 20 of 48 positions shown · non-contrast
Comparison: CTA from earlier the same day

CLINICAL DATA: Vertebral artery dissection

EXAM:
MRA HEAD WITHOUT CONTRAST
TECHNIQUE: Angiographic images of the Circle of Willis were acquired using MRA
technique without intravenous contrast.

[Series 5: 3d cow · axial · 0.5mm · 0.41mm/px · z∈[-100,-19]mm · 20 of 172 slices shown]
[im 1/172]
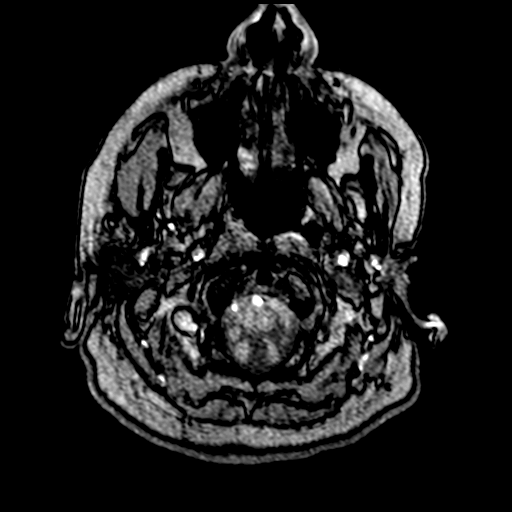
[im 4/172]
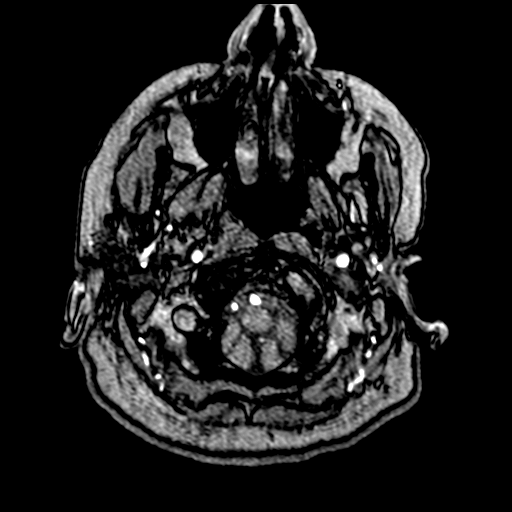
[im 8/172]
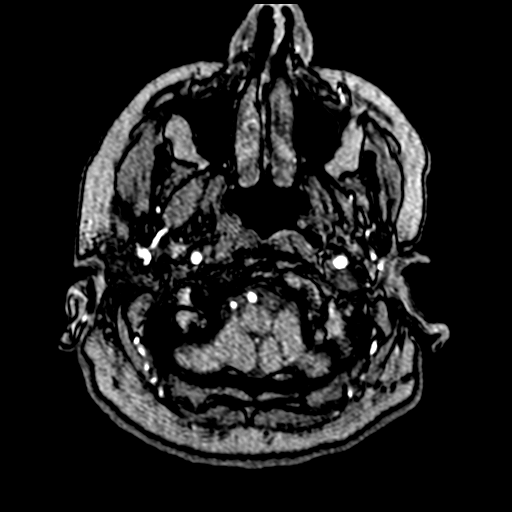
[im 11/172]
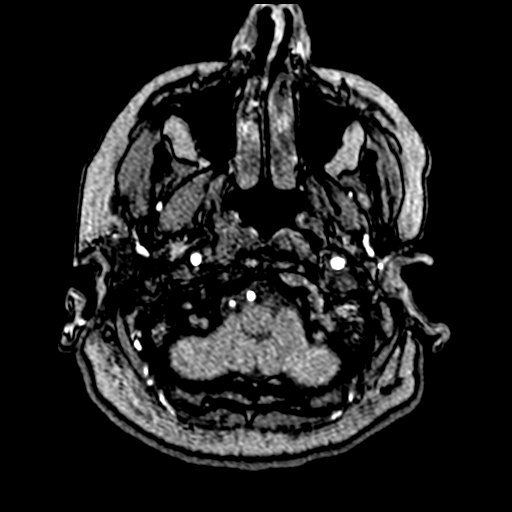
[im 15/172]
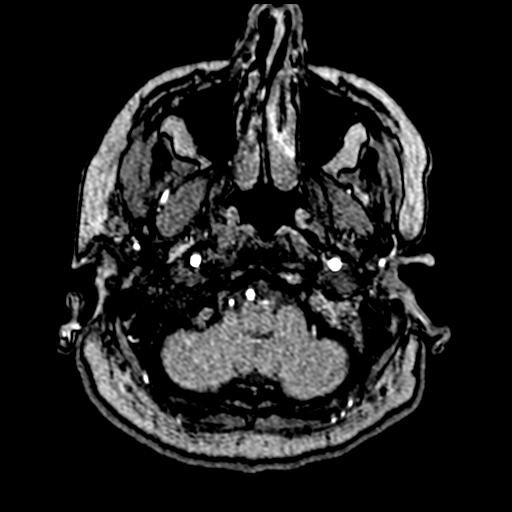
[im 19/172]
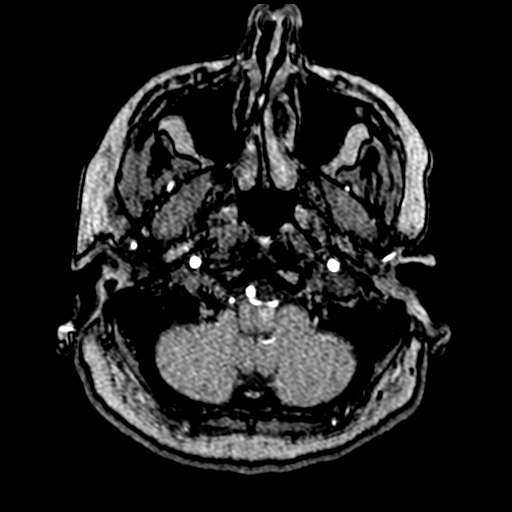
[im 22/172]
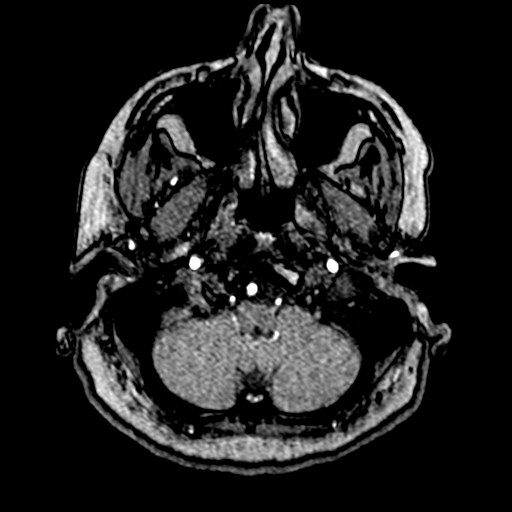
[im 26/172]
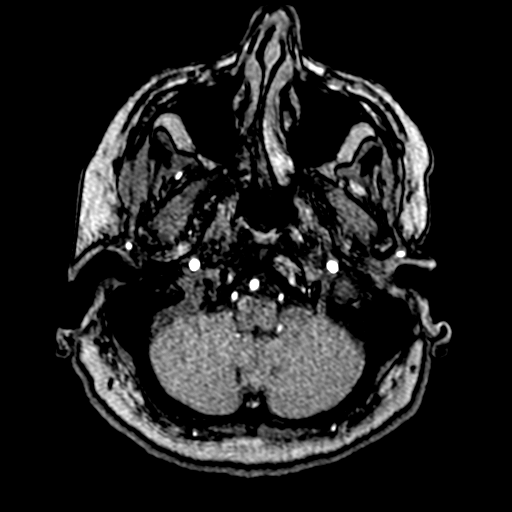
[im 30/172]
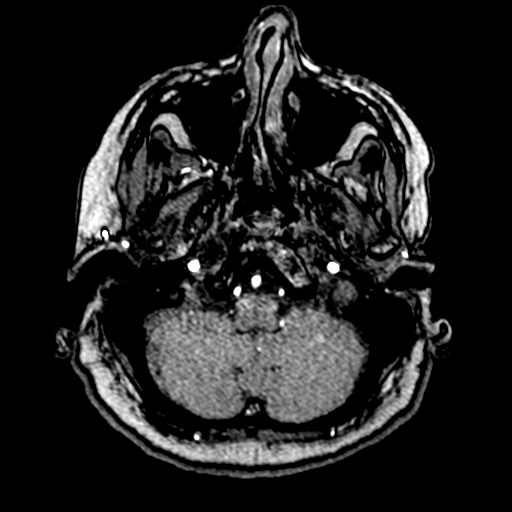
[im 33/172]
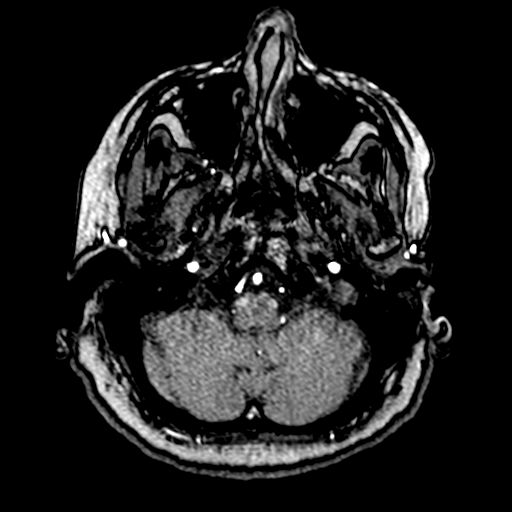
[im 37/172]
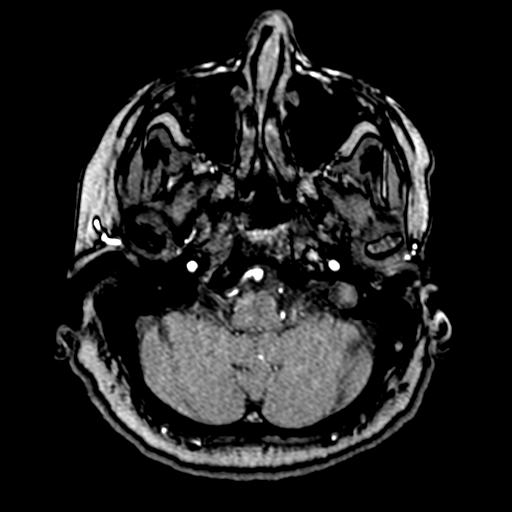
[im 41/172]
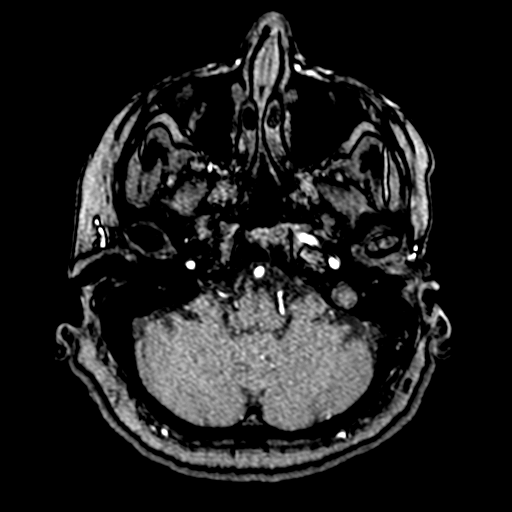
[im 55/172]
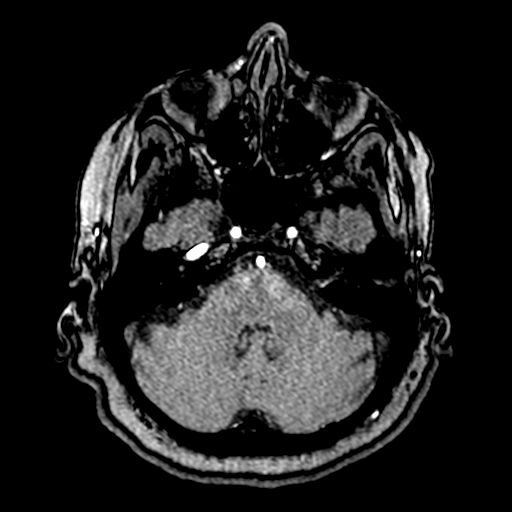
[im 77/172]
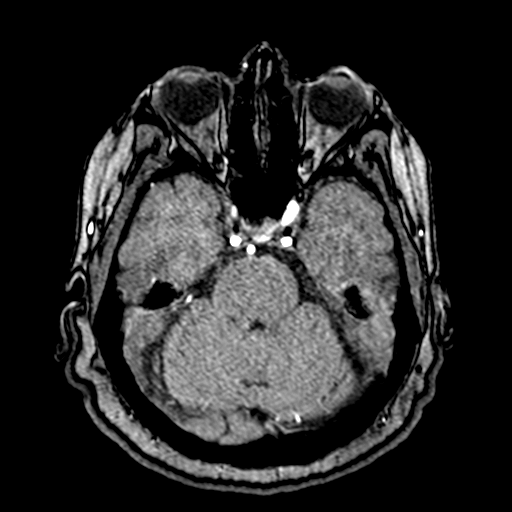
[im 88/172]
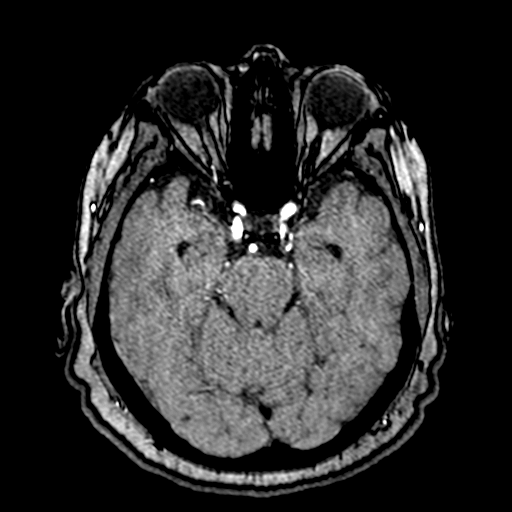
[im 99/172]
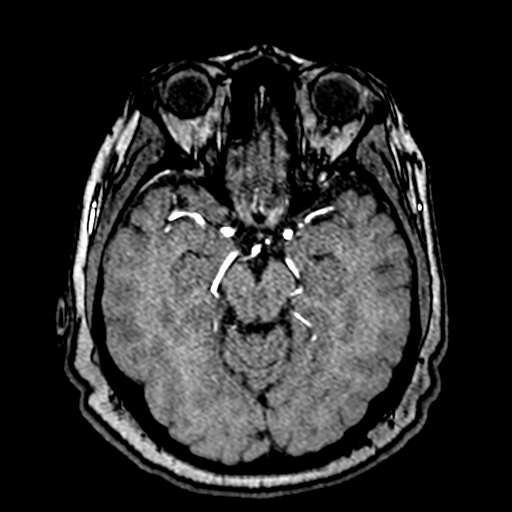
[im 121/172]
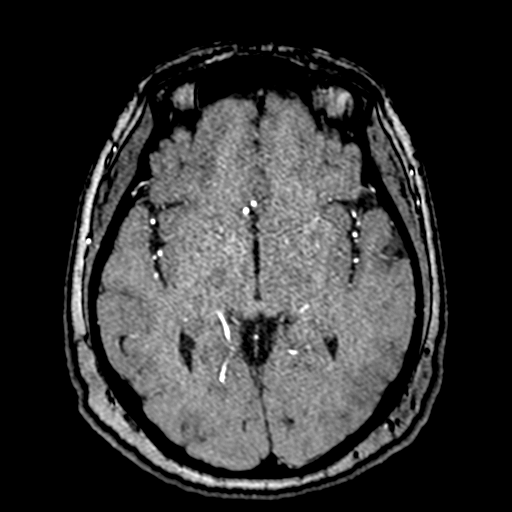
[im 142/172]
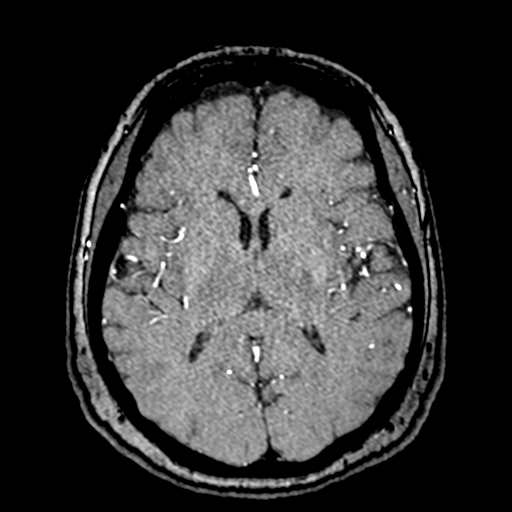
[im 146/172]
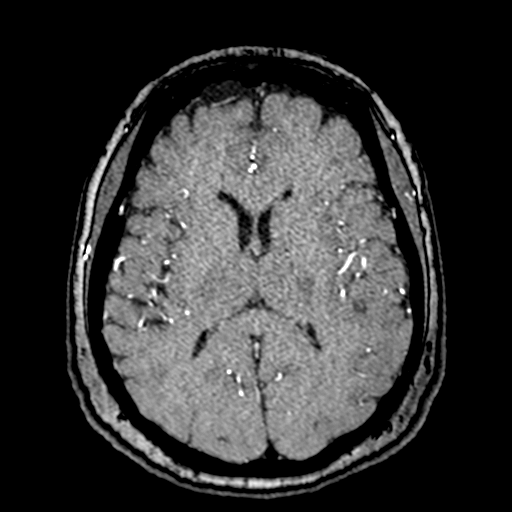
[im 164/172]
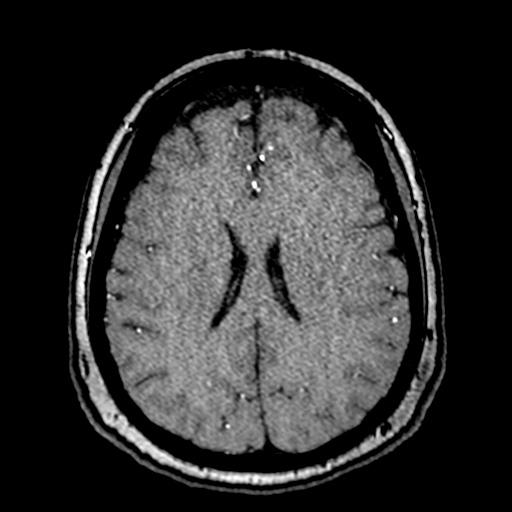

[20 of 48 positions shown; findings below may reference images not displayed]

FINDINGS: The level of left vertebral findings was not covered on this
intracranial MRA. The vertebral and basilar arteries where seen are
smoothly contoured and widely patent. Fetal type left PCA. No branch
occlusion, beading, or aneurysm. Mild left A1 segment stenosis which
appears atheromatous.
IMPRESSION: 1. No emergent finding or flow limiting stenosis.
2. The left V3 segment abnormality by CTA was not covered on this
intracranial MRA.

## 2021-03-11 IMAGING — MR MR HEAD W/O CM
10 of 11 series · 43 of 48 positions shown · non-contrast
Comparison: Head CT and CTA from the same day

CLINICAL DATA: Abnormal CT.  Headache

EXAM:
MRI HEAD WITHOUT CONTRAST
TECHNIQUE: Multiplanar, multiecho pulse sequences of the brain and surrounding
structures were obtained without intravenous contrast.

[Series 5: DWI · axial · 3.0mm · 0.88mm/px · z∈[-70,+76]mm · 10 of 100 slices shown (1 of 4)]
[im 1/100]
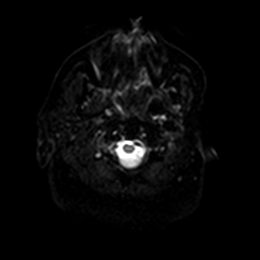
[im 12/100]
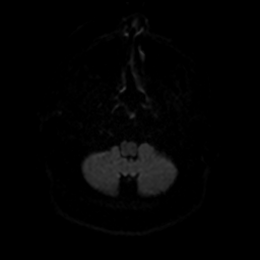
[im 23/100]
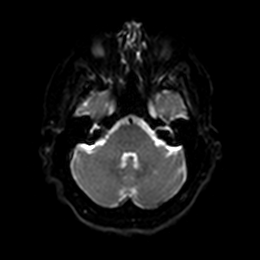
[im 34/100]
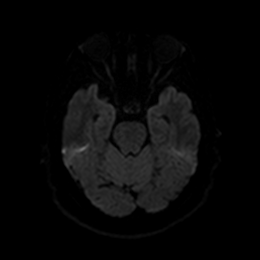
[im 45/100]
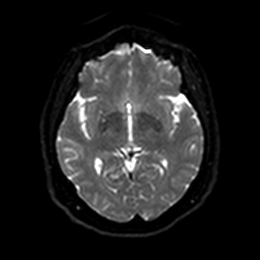
[im 56/100]
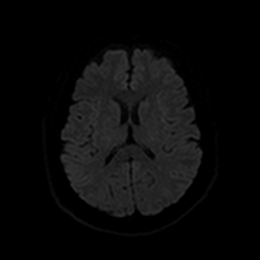
[im 67/100]
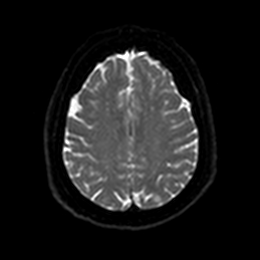
[im 78/100]
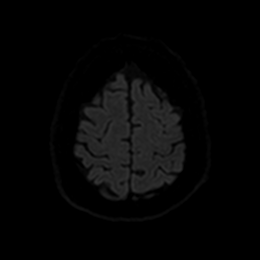
[im 89/100]
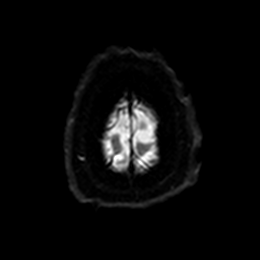
[im 100/100]
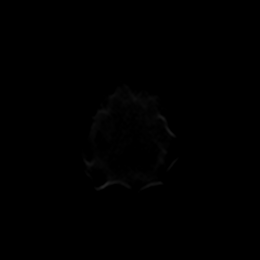

[Series 6: DWI · axial · 3.0mm · 0.88mm/px · z∈[-70,+76]mm · 5 of 50 slices shown (2 of 4)]
[im 1/50]
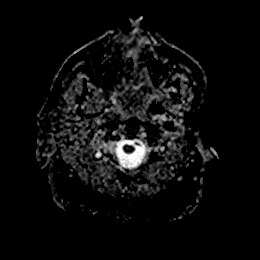
[im 13/50]
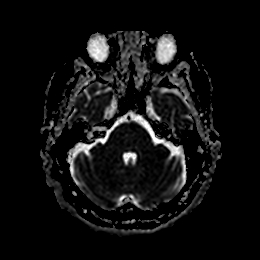
[im 25/50]
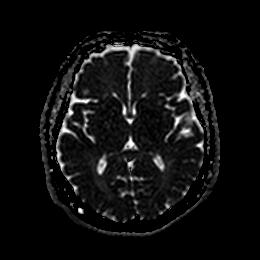
[im 37/50]
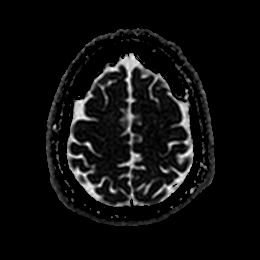
[im 50/50]
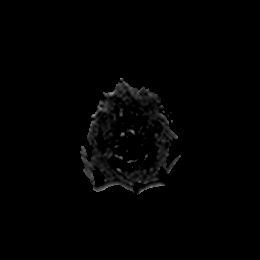

[Series 7: DWI · coronal · 4.0mm · 0.88mm/px · 6 of 66 slices shown (3 of 4)]
[im 1/66]
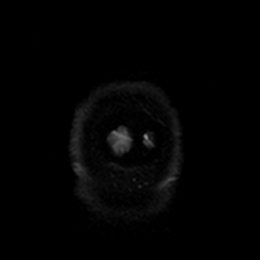
[im 14/66]
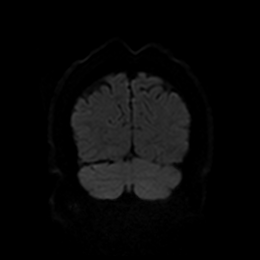
[im 27/66]
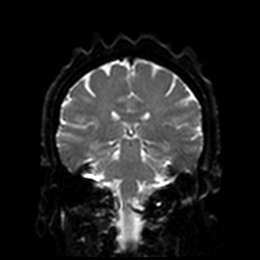
[im 40/66]
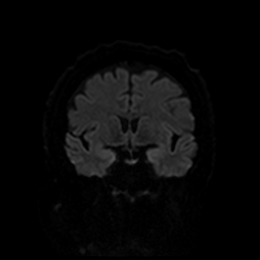
[im 53/66]
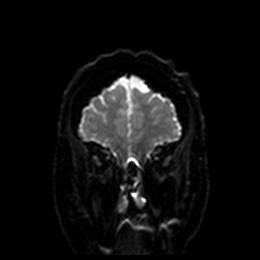
[im 66/66]
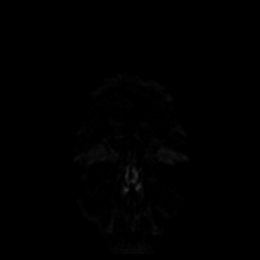

[Series 8: DWI · coronal · 4.0mm · 0.88mm/px · 3 of 33 slices shown (4 of 4)]
[im 1/33]
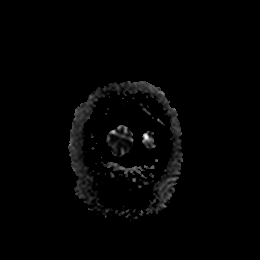
[im 17/33]
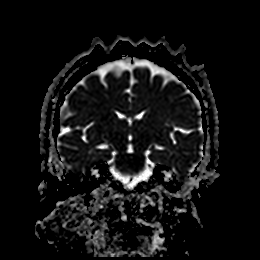
[im 33/33]
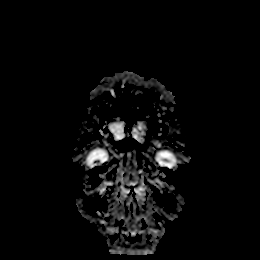

[Series 9: T1 · sagittal · 5.0mm · 0.75mm/px · 2 of 23 slices shown]
[im 1/23]
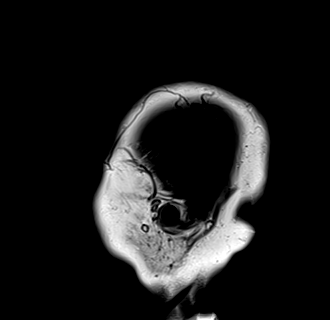
[im 23/23]
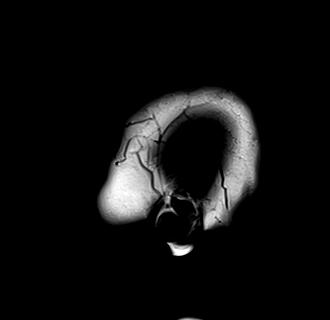

[Series 10: T2 · axial · 5.0mm · 0.72mm/px · z∈[-69,+75]mm · 2 of 25 slices shown (1 of 2)]
[im 1/25]
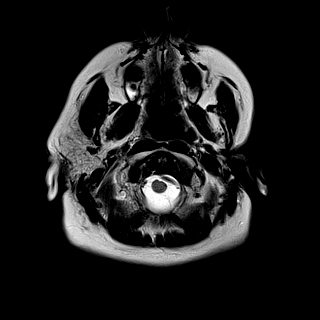
[im 25/25]
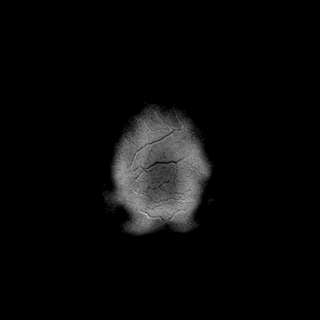

[Series 11: FLAIR · axial · 5.0mm · 0.45mm/px · z∈[-69,+75]mm · 2 of 25 slices shown]
[im 1/25]
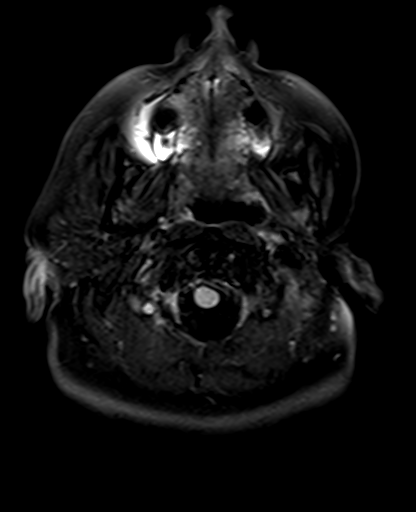
[im 25/25]
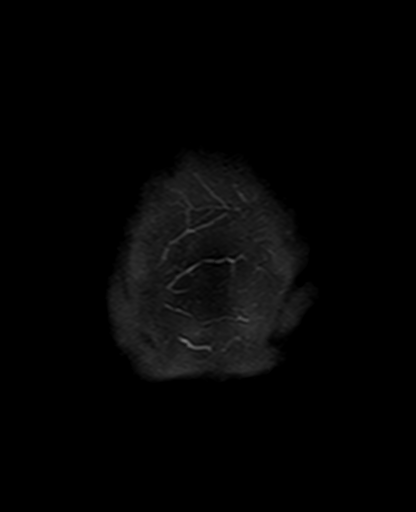

[Series 13: pha_images · axial · 3.0mm · 0.90mm/px · z∈[-85,+91]mm · 5 of 60 slices shown]
[im 1/60]
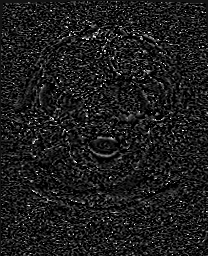
[im 15/60]
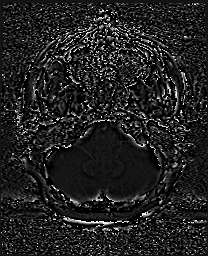
[im 30/60]
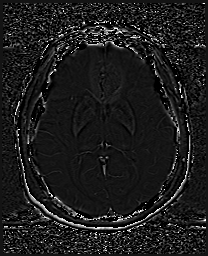
[im 45/60]
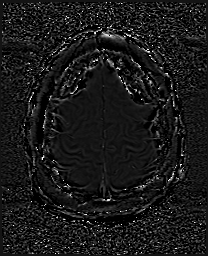
[im 60/60]
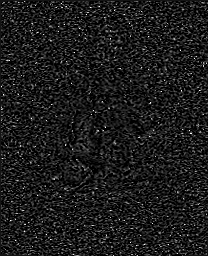

[Series 14: swi_images · axial · 3.0mm · 0.90mm/px · z∈[-85,+91]mm · 5 of 60 slices shown]
[im 1/60]
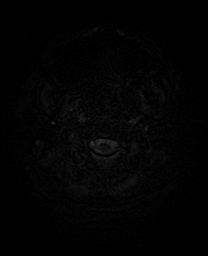
[im 15/60]
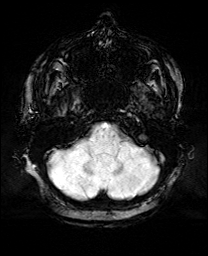
[im 30/60]
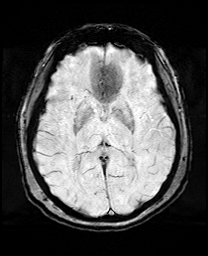
[im 45/60]
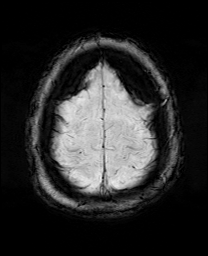
[im 60/60]
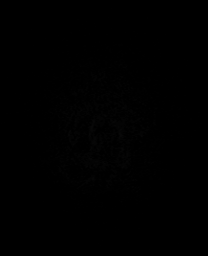

[Series 17: T2 · coronal · 5.0mm · 0.34mm/px · 3 of 29 slices shown (2 of 2)]
[im 1/29]
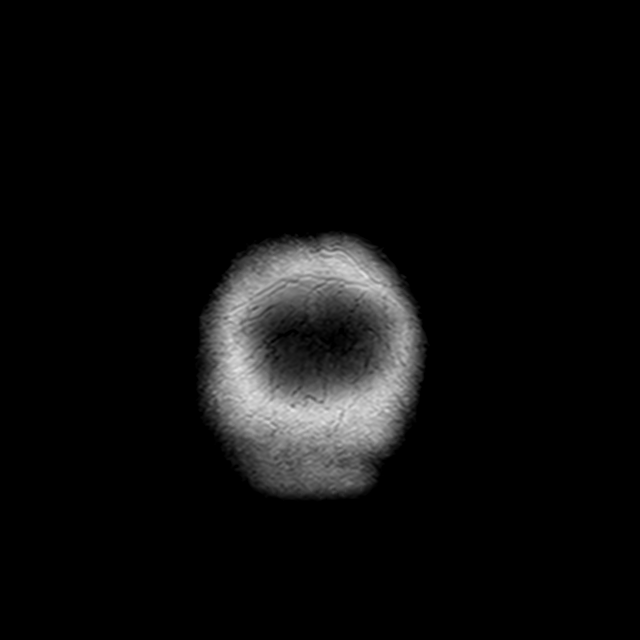
[im 15/29]
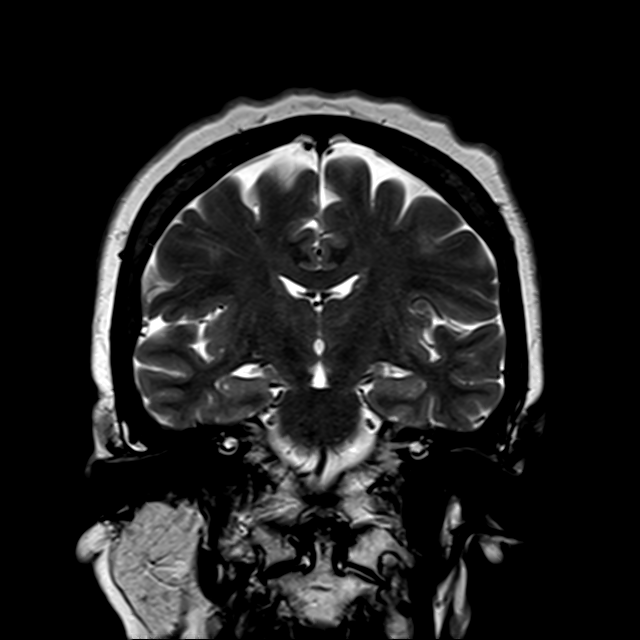
[im 29/29]
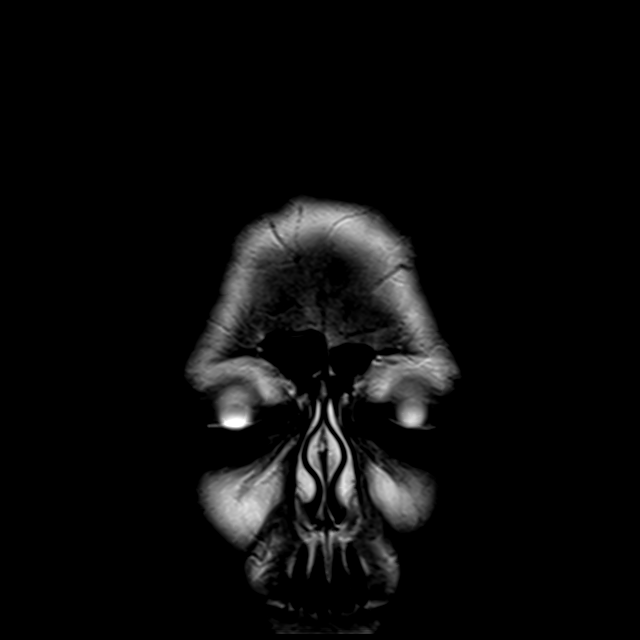

[43 of 48 positions shown; findings below may reference images not displayed]

FINDINGS: Brain: No acute infarction, hemorrhage, hydrocephalus, extra-axial
collection or mass lesion.

Vascular: Normal flow voids.

Skull and upper cervical spine: Normal marrow signal.

Sinuses/Orbits: Partial left mastoid opacification which may be
treatment related. Partially covered remote left parotidectomy.
IMPRESSION: Normal appearance of the brain.

## 2021-03-11 MED ORDER — IOPAMIDOL (ISOVUE-370) INJECTION 76%
50.0000 mL | Freq: Once | INTRAVENOUS | Status: AC | PRN
  Administered 2021-03-11: 50 mL via INTRAVENOUS

## 2021-03-11 NOTE — ED Provider Notes (Signed)
[facility] DEPARTMENT Provider Note   CSN: [id number] Arrival date & time: 03/10/21  2156     History Chief Complaint  Patient presents with   Headache    Alison Ortiz is a 70 y.o. female.  Patient presents chief complaint of headache.  Describes a throbbing frontal headache.  Describes a sudden onset occurred about 1 to 2 hours just prior to arrival.  She denies any specific activity other than just walking with her friend.  Denies fevers or cough or vomiting or diarrhea.  No prior headache history per patient.      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 No pertinent surgical history.   OB History   No obstetric history on file.     History reviewed. No pertinent family history.  Social History   Tobacco Use   Smoking status: Never   Smokeless tobacco: Never  Substance Use Topics   Alcohol use: Yes    Comment: rare   Drug use: No    Home Medications Prior to Admission medications   Medication Sig Start Date End Date Taking? Authorizing Provider  amLODipine (NORVASC) 2.5 MG tablet Take 2.5 mg by mouth daily. 10/26/17   [provider]  aspirin EC 81 MG tablet Take 81 mg by mouth daily.    [provider]  CALCIUM PO Take by mouth 2 (two) times daily.    [provider]  cetirizine (ZYRTEC) 10 MG tablet Take 10 mg by mouth daily as needed for allergies.    [provider]  CRESTOR 20 MG tablet Take 20 mg by mouth daily.  02/19/14   [provider]  levothyroxine (SYNTHROID, LEVOTHROID) 75 MCG tablet Take 75 mcg by mouth daily. 10/28/17   [provider]  metoprolol succinate (TOPROL-XL) 25 MG 24 hr tablet Take 25 mg by mouth daily.  02/11/14   [provider]  omeprazole (PRILOSEC) 20 MG  capsule Take 20 mg by mouth daily.    [provider]  traMADol (ULTRAM) 50 MG tablet Take 50 mg by mouth 2 (two) times daily as needed for moderate pain.  01/05/14   [provider]    Allergies    Patient has no known allergies.  Review of Systems   Review of Systems  Constitutional:  Negative for fever.  HENT:  Negative for ear pain.   Eyes:  Negative for pain.  Respiratory:  Negative for cough.   Cardiovascular:  Negative for chest pain.  Gastrointestinal:  Negative for abdominal pain.  Genitourinary:  Negative for flank pain.  Musculoskeletal:  Negative for back pain.  Skin:  Negative for rash.  Neurological:  Positive for headaches.   Physical Exam Updated Vital Signs BP (!) 149/87 (BP Location: Right Arm)    Pulse 70    Temp 98.3 F (36.8 C) (Oral)    Resp 20    SpO2 93%   Physical Exam Constitutional:      General: She is not in acute distress.    Appearance: Normal appearance.  HENT:     Head: Normocephalic.     Nose: Nose normal.  Eyes:     Extraocular Movements: Extraocular movements intact.  Cardiovascular:     Rate and Rhythm: Normal rate.  Pulmonary:  Effort: Pulmonary effort is normal.  Musculoskeletal:        General: Normal range of motion.     Cervical back: Normal range of motion.  Neurological:     General: No focal deficit present.     Mental Status: She is alert and oriented to person, place, and time. Mental status is at baseline.     Cranial Nerves: No cranial nerve deficit.     Motor: No weakness.     Gait: Gait normal.    ED Results / Procedures / Treatments   Labs (all labs ordered are listed, but only abnormal results are displayed) Labs Reviewed  COMPREHENSIVE METABOLIC PANEL - Abnormal; Notable for the following components:      Result Value   Glucose, Bld 113 (*)    All other components within normal limits  RESP PANEL BY RT-PCR (FLU A&B, COVID) ARPGX2  CBC WITH DIFFERENTIAL/PLATELET     EKG None  Radiology CT Angio Head W or Wo Contrast  Result Date: 03/11/2021 CLINICAL DATA:  Sudden headache, severe EXAM: CT ANGIOGRAPHY HEAD TECHNIQUE: Multidetector CT imaging of the head was performed using the standard protocol during bolus administration of intravenous contrast. Multiplanar CT image reconstructions and MIPs were obtained to evaluate the vascular anatomy. CONTRAST:  6mL ISOVUE-370 IOPAMIDOL (ISOVUE-370) INJECTION 76% COMPARISON:  No prior CTA, correlation is made with 03/10/2021 CT head FINDINGS: CT HEAD For noncontrast findings, please see same day CT head. CTA HEAD Anterior circulation: Both distal extracranial and intracranial internal carotid arteries are patent to the termini, without stenosis. A1 segments patent. Normal anterior communicating artery. Anterior cerebral arteries are patent to their distal aspects. No M1 stenosis or occlusion. Normal MCA bifurcations. Distal MCA branches perfused; enhancement is slightly more robust in the left MCA branches, although there is no focal stenosis or occlusion of right MCA branches. Posterior circulation: In the distal left V3 segment, there is an anteromedially directed outpouching, which measures 1 x 2 mm (series 9, image 106 and series 8, image 184), possibly a small dissection. Intracranial vertebral arteries are patent to the vertebrobasilar junction without stenosis. Posterior inferior cerebral arteries patent bilaterally. Basilar patent to its distal aspect. Superior cerebellar arteries patent bilaterally. PCAs perfused to their distal aspects without stenosis. The left posterior communicating artery is visualized. The right posterior communicating artery is not visualized. Venous sinuses: As permitted by contrast timing, patent. Anatomic variants: None significant Review of the MIP images confirms the above findings. IMPRESSION: 1. Small outpouching from the distal left extracranial vertebral artery, which may represent a  small dissection. 2. No intracranial large vessel occlusion or significant stenosis. Electronically Signed   By: Merilyn Baba M.D.   On: 03/11/2021 01:16   CT HEAD WO CONTRAST (5MM)  Result Date: 03/10/2021 CLINICAL DATA:  Headache. EXAM: CT HEAD WITHOUT CONTRAST TECHNIQUE: Contiguous axial images were obtained from the base of the skull through the vertex without intravenous contrast. COMPARISON:  None. FINDINGS: Brain: No evidence of acute infarction, hemorrhage, hydrocephalus, extra-axial collection or mass lesion/mass effect. Vascular: No hyperdense vessel or unexpected calcification. Skull: Normal. Negative for fracture or focal lesion. Sinuses/Orbits: No acute finding. Other: None. IMPRESSION: No acute intracranial pathology. Electronically Signed   By: Virgina Norfolk M.D.   On: 03/10/2021 22:59   MR ANGIO HEAD WO CONTRAST  Result Date: 03/11/2021 CLINICAL DATA:  Vertebral artery dissection EXAM: MRA HEAD WITHOUT CONTRAST TECHNIQUE: Angiographic images of the Circle of Willis were acquired using MRA technique without intravenous contrast. COMPARISON:  CTA from earlier the same day FINDINGS: The level of left vertebral findings was not covered on this intracranial MRA. The vertebral and basilar arteries where seen are smoothly contoured and widely patent. Fetal type left PCA. No branch occlusion, beading, or aneurysm. Mild left A1 segment stenosis which appears atheromatous. IMPRESSION: 1. No emergent finding or flow limiting stenosis. 2. The left V3 segment abnormality by CTA was not covered on this intracranial MRA. Electronically Signed   By: Jorje Guild M.D.   On: 03/11/2021 05:28   MR BRAIN WO CONTRAST  Result Date: 03/11/2021 CLINICAL DATA:  Abnormal CT.  Headache EXAM: MRI HEAD WITHOUT CONTRAST TECHNIQUE: Multiplanar, multiecho pulse sequences of the brain and surrounding structures were obtained without intravenous contrast. COMPARISON:  Head CT and CTA from the same day FINDINGS:  Brain: No acute infarction, hemorrhage, hydrocephalus, extra-axial collection or mass lesion. Vascular: Normal flow voids. Skull and upper cervical spine: Normal marrow signal. Sinuses/Orbits: Partial left mastoid opacification which may be treatment related. Partially covered remote left parotidectomy. IMPRESSION: Normal appearance of the brain. Electronically Signed   By: Jorje Guild M.D.   On: 03/11/2021 04:21    Procedures Procedures   Medications Ordered in ED Medications  acetaminophen (TYLENOL) tablet 1,000 mg (1,000 mg Oral Given 03/10/21 2228)  ondansetron (ZOFRAN) injection 4 mg (4 mg Intravenous Given 03/11/21 0012)  prochlorperazine (COMPAZINE) injection 10 mg (10 mg Intravenous Given 03/11/21 0012)  ketorolac (TORADOL) 15 MG/ML injection 15 mg (15 mg Intravenous Given 03/11/21 0011)  iopamidol (ISOVUE-370) 76 % injection 50 mL (50 mLs Intravenous Contrast Given 03/11/21 0105)    ED Course  I have reviewed the triage vital signs and the nursing notes.  Pertinent labs & imaging results that were available during my care of the patient were reviewed by me and considered in my medical decision making (see chart for details).    MDM Rules/Calculators/A&P                         Patient had basic labs are done which are unremarkable CT head is unremarkable as well.  Imaging occurred within 3 to 4 hours of onset of headache.  Given negative work-up I doubt subarachnoid hemorrhage.  However the patient states her headache was sudden in onset, CT angio pursued.  Concerning for possible vertebral artery dissection.  Case discussed with vascular surgery recommending daily aspirin and consultation with neurosurgery.  Case discussed with on-call neurosurgery, recommending MR angio of the head and if negative to have the patient discharged home with continued daily baby aspirin.  MRI of the head is unremarkable.  Will recommend aspirin and outpatient follow-up with neurosurgery within  the week.  Recommending immediate return for worsening symptoms or any additional concerns.     Final Clinical Impression(s) / ED Diagnoses Final diagnoses:  Nonintractable headache, unspecified chronicity pattern, unspecified headache type    Rx / DC Orders ED Discharge Orders     None        Luna Fuse, MD 03/11/21 [phone]

## 2021-03-11 NOTE — Discharge Instructions (Addendum)
Your images showed an area of outpouching on your left vertebral artery.  Neurosurgery recommends daily aspirin 81 mg and to follow-up with their office.  Call their office tomorrow to schedule an appointment.  Return to the ER if you have worsening symptoms new numbness weakness or any additional concerns.

## 2021-03-11 NOTE — ED Notes (Signed)
Patient transported to MRI 

## 2021-03-11 NOTE — ED Notes (Signed)
Patient transported to CT 

## 2021-06-20 DIAGNOSIS — G4486 Cervicogenic headache: Secondary | ICD-10-CM | POA: Diagnosis not present

## 2021-06-20 DIAGNOSIS — E559 Vitamin D deficiency, unspecified: Secondary | ICD-10-CM | POA: Diagnosis not present

## 2021-06-20 DIAGNOSIS — F411 Generalized anxiety disorder: Secondary | ICD-10-CM | POA: Diagnosis not present

## 2021-06-20 DIAGNOSIS — I726 Aneurysm of vertebral artery: Secondary | ICD-10-CM | POA: Diagnosis not present

## 2021-06-20 DIAGNOSIS — E039 Hypothyroidism, unspecified: Secondary | ICD-10-CM | POA: Diagnosis not present

## 2021-06-20 DIAGNOSIS — I10 Essential (primary) hypertension: Secondary | ICD-10-CM | POA: Diagnosis not present

## 2021-07-04 DIAGNOSIS — E559 Vitamin D deficiency, unspecified: Secondary | ICD-10-CM | POA: Diagnosis not present

## 2021-07-04 DIAGNOSIS — E039 Hypothyroidism, unspecified: Secondary | ICD-10-CM | POA: Diagnosis not present

## 2021-07-04 DIAGNOSIS — I10 Essential (primary) hypertension: Secondary | ICD-10-CM | POA: Diagnosis not present

## 2021-08-11 DIAGNOSIS — R519 Headache, unspecified: Secondary | ICD-10-CM | POA: Diagnosis not present

## 2021-10-23 DIAGNOSIS — H6062 Unspecified chronic otitis externa, left ear: Secondary | ICD-10-CM | POA: Diagnosis not present

## 2021-10-23 DIAGNOSIS — C07 Malignant neoplasm of parotid gland: Secondary | ICD-10-CM | POA: Diagnosis not present

## 2021-11-01 DIAGNOSIS — Z1231 Encounter for screening mammogram for malignant neoplasm of breast: Secondary | ICD-10-CM | POA: Diagnosis not present

## 2021-11-20 DIAGNOSIS — H903 Sensorineural hearing loss, bilateral: Secondary | ICD-10-CM | POA: Diagnosis not present

## 2021-12-26 DIAGNOSIS — R1310 Dysphagia, unspecified: Secondary | ICD-10-CM | POA: Diagnosis not present

## 2021-12-26 DIAGNOSIS — J383 Other diseases of vocal cords: Secondary | ICD-10-CM | POA: Diagnosis not present

## 2021-12-26 DIAGNOSIS — R490 Dysphonia: Secondary | ICD-10-CM | POA: Diagnosis not present

## 2021-12-26 DIAGNOSIS — R053 Chronic cough: Secondary | ICD-10-CM | POA: Diagnosis not present

## 2021-12-26 DIAGNOSIS — J3800 Paralysis of vocal cords and larynx, unspecified: Secondary | ICD-10-CM | POA: Diagnosis not present

## 2021-12-26 DIAGNOSIS — L539 Erythematous condition, unspecified: Secondary | ICD-10-CM | POA: Diagnosis not present

## 2022-01-25 DIAGNOSIS — H5213 Myopia, bilateral: Secondary | ICD-10-CM | POA: Diagnosis not present

## 2022-01-25 DIAGNOSIS — H524 Presbyopia: Secondary | ICD-10-CM | POA: Diagnosis not present

## 2022-02-01 DIAGNOSIS — R1310 Dysphagia, unspecified: Secondary | ICD-10-CM | POA: Diagnosis not present

## 2022-02-01 DIAGNOSIS — R1312 Dysphagia, oropharyngeal phase: Secondary | ICD-10-CM | POA: Diagnosis not present

## 2022-02-01 DIAGNOSIS — R633 Feeding difficulties, unspecified: Secondary | ICD-10-CM | POA: Diagnosis not present

## 2022-02-05 DIAGNOSIS — R490 Dysphonia: Secondary | ICD-10-CM | POA: Diagnosis not present

## 2022-02-05 DIAGNOSIS — R1312 Dysphagia, oropharyngeal phase: Secondary | ICD-10-CM | POA: Diagnosis not present

## 2022-02-05 DIAGNOSIS — R053 Chronic cough: Secondary | ICD-10-CM | POA: Diagnosis not present

## 2022-02-05 DIAGNOSIS — J383 Other diseases of vocal cords: Secondary | ICD-10-CM | POA: Diagnosis not present

## 2022-04-05 DIAGNOSIS — R490 Dysphonia: Secondary | ICD-10-CM | POA: Diagnosis not present

## 2022-04-05 DIAGNOSIS — R053 Chronic cough: Secondary | ICD-10-CM | POA: Diagnosis not present

## 2022-04-05 DIAGNOSIS — J383 Other diseases of vocal cords: Secondary | ICD-10-CM | POA: Diagnosis not present

## 2022-04-12 DIAGNOSIS — J383 Other diseases of vocal cords: Secondary | ICD-10-CM | POA: Diagnosis not present

## 2022-04-12 DIAGNOSIS — R490 Dysphonia: Secondary | ICD-10-CM | POA: Diagnosis not present

## 2022-04-12 DIAGNOSIS — R053 Chronic cough: Secondary | ICD-10-CM | POA: Diagnosis not present

## 2022-04-19 DIAGNOSIS — R490 Dysphonia: Secondary | ICD-10-CM | POA: Diagnosis not present

## 2022-04-19 DIAGNOSIS — J383 Other diseases of vocal cords: Secondary | ICD-10-CM | POA: Diagnosis not present

## 2022-04-19 DIAGNOSIS — R053 Chronic cough: Secondary | ICD-10-CM | POA: Diagnosis not present

## 2022-04-30 DIAGNOSIS — Z923 Personal history of irradiation: Secondary | ICD-10-CM | POA: Diagnosis not present

## 2022-04-30 DIAGNOSIS — C07 Malignant neoplasm of parotid gland: Secondary | ICD-10-CM | POA: Diagnosis not present

## 2022-04-30 DIAGNOSIS — Y842 Radiological procedure and radiotherapy as the cause of abnormal reaction of the patient, or of later complication, without mention of misadventure at the time of the procedure: Secondary | ICD-10-CM | POA: Diagnosis not present

## 2022-04-30 DIAGNOSIS — H60312 Diffuse otitis externa, left ear: Secondary | ICD-10-CM | POA: Diagnosis not present

## 2022-04-30 DIAGNOSIS — H6062 Unspecified chronic otitis externa, left ear: Secondary | ICD-10-CM | POA: Diagnosis not present

## 2022-05-08 DIAGNOSIS — R490 Dysphonia: Secondary | ICD-10-CM | POA: Diagnosis not present

## 2022-05-08 DIAGNOSIS — J383 Other diseases of vocal cords: Secondary | ICD-10-CM | POA: Diagnosis not present

## 2022-05-08 DIAGNOSIS — R053 Chronic cough: Secondary | ICD-10-CM | POA: Diagnosis not present

## 2022-05-22 DIAGNOSIS — E039 Hypothyroidism, unspecified: Secondary | ICD-10-CM | POA: Diagnosis not present

## 2022-05-22 DIAGNOSIS — F411 Generalized anxiety disorder: Secondary | ICD-10-CM | POA: Diagnosis not present

## 2022-06-28 DIAGNOSIS — F411 Generalized anxiety disorder: Secondary | ICD-10-CM | POA: Diagnosis not present

## 2022-06-28 DIAGNOSIS — F330 Major depressive disorder, recurrent, mild: Secondary | ICD-10-CM | POA: Diagnosis not present

## 2022-06-28 DIAGNOSIS — F4381 Prolonged grief disorder: Secondary | ICD-10-CM | POA: Diagnosis not present

## 2022-07-12 DIAGNOSIS — F330 Major depressive disorder, recurrent, mild: Secondary | ICD-10-CM | POA: Diagnosis not present

## 2022-07-12 DIAGNOSIS — F4381 Prolonged grief disorder: Secondary | ICD-10-CM | POA: Diagnosis not present

## 2022-07-12 DIAGNOSIS — F411 Generalized anxiety disorder: Secondary | ICD-10-CM | POA: Diagnosis not present

## 2022-08-08 DIAGNOSIS — F330 Major depressive disorder, recurrent, mild: Secondary | ICD-10-CM | POA: Diagnosis not present

## 2022-08-08 DIAGNOSIS — F4381 Prolonged grief disorder: Secondary | ICD-10-CM | POA: Diagnosis not present

## 2022-08-08 DIAGNOSIS — F411 Generalized anxiety disorder: Secondary | ICD-10-CM | POA: Diagnosis not present

## 2022-09-03 DIAGNOSIS — C07 Malignant neoplasm of parotid gland: Secondary | ICD-10-CM | POA: Diagnosis not present

## 2022-09-06 DIAGNOSIS — F4381 Prolonged grief disorder: Secondary | ICD-10-CM | POA: Diagnosis not present

## 2022-10-08 DIAGNOSIS — F4381 Prolonged grief disorder: Secondary | ICD-10-CM | POA: Diagnosis not present

## 2022-11-07 DIAGNOSIS — Z1231 Encounter for screening mammogram for malignant neoplasm of breast: Secondary | ICD-10-CM | POA: Diagnosis not present

## 2022-12-11 DIAGNOSIS — F4381 Prolonged grief disorder: Secondary | ICD-10-CM | POA: Diagnosis not present

## 2023-02-07 DIAGNOSIS — F4323 Adjustment disorder with mixed anxiety and depressed mood: Secondary | ICD-10-CM | POA: Diagnosis not present

## 2023-03-01 DIAGNOSIS — H903 Sensorineural hearing loss, bilateral: Secondary | ICD-10-CM | POA: Diagnosis not present
